# Patient Record
Sex: Female | Born: 1980
Health system: Southern US, Community
[De-identification: ages and names within clinical notes are randomized; demographics above are authoritative.]

## PROBLEM LIST (undated history)

## (undated) ENCOUNTER — Inpatient Hospital Stay (HOSPITAL_COMMUNITY): Payer: Self-pay

## (undated) HISTORY — PX: WISDOM TOOTH EXTRACTION: SHX21

---

## 2005-07-24 ENCOUNTER — Other Ambulatory Visit: Admission: RE | Admit: 2005-07-24 | Discharge: 2005-07-24 | Payer: Self-pay | Admitting: Gynecology

## 2010-09-03 ENCOUNTER — Inpatient Hospital Stay (INDEPENDENT_AMBULATORY_CARE_PROVIDER_SITE_OTHER)
Admission: RE | Admit: 2010-09-03 | Discharge: 2010-09-03 | Disposition: A | Discharge status: Home / Self Care | Payer: BC Managed Care – PPO | Source: Ambulatory Visit | Attending: Family Medicine | Admitting: Family Medicine

## 2010-09-03 DIAGNOSIS — R42 Dizziness and giddiness: Secondary | ICD-10-CM

## 2011-07-25 ENCOUNTER — Encounter (HOSPITAL_BASED_OUTPATIENT_CLINIC_OR_DEPARTMENT_OTHER): Payer: Self-pay

## 2011-07-25 ENCOUNTER — Emergency Department (HOSPITAL_BASED_OUTPATIENT_CLINIC_OR_DEPARTMENT_OTHER)
Admission: EM | Admit: 2011-07-25 | Discharge: 2011-07-25 | Disposition: A | Discharge status: Home Health | Payer: BC Managed Care – PPO | Attending: Emergency Medicine | Admitting: Emergency Medicine

## 2011-07-25 DIAGNOSIS — R51 Headache: Secondary | ICD-10-CM | POA: Insufficient documentation

## 2011-07-25 DIAGNOSIS — R1110 Vomiting, unspecified: Secondary | ICD-10-CM | POA: Insufficient documentation

## 2011-07-25 MED ORDER — METOCLOPRAMIDE HCL 10 MG PO TABS
10.0000 mg | ORAL_TABLET | Freq: Once | ORAL | Status: AC
  Administered 2011-07-25: 10 mg via ORAL
  Filled 2011-07-25: qty 1

## 2011-07-25 MED ORDER — HYDROCODONE-ACETAMINOPHEN 5-325 MG PO TABS
2.0000 | ORAL_TABLET | Freq: Once | ORAL | Status: AC
  Administered 2011-07-25: 2 via ORAL
  Filled 2011-07-25: qty 2

## 2011-07-25 NOTE — ED Notes (Addendum)
C/o HA n/v started 11am today-has taken tylenol and motrin

## 2011-07-25 NOTE — ED Provider Notes (Signed)
Medical screening examination/treatment/procedure(s) were performed by non-physician practitioner and as supervising physician I was immediately available for consultation/collaboration.   Heela Heishman M Frantz Quattrone, MD 07/25/11 1636 

## 2011-07-25 NOTE — Discharge Instructions (Signed)

## 2011-07-25 NOTE — ED Notes (Signed)
After meds given pt states she wants to go home-[facility]PA notified pt ready for d/c

## 2011-07-25 NOTE — ED Provider Notes (Signed)
History     CSN: [id number]  Arrival date & time 07/25/11  1416   First MD Initiated Contact with Patient 07/25/11 1452      Chief Complaint  Patient presents with  . Headache    (Consider location/radiation/quality/duration/timing/severity/associated sxs/prior treatment) Patient is a 31 y.o. female presenting with headaches. The history is provided by the patient. No language interpreter was used.  Headache  This is a new problem. The current episode started 3 to 5 hours ago. The problem occurs constantly. The problem has been rapidly worsening. The headache is associated with bright light. The pain is located in the frontal region. The pain is at a severity of 7/10. The pain is moderate. The pain does not radiate. Associated symptoms include vomiting. She has tried acetaminophen for the symptoms. The treatment provided no relief.  Pt reports she has a headache today.  No relief with tylenol at home.    Past Medical History  Diagnosis Date  . Migraine     History reviewed. No pertinent past surgical history.  No family history on file.  History  Substance Use Topics  . Smoking status: Never Smoker   . Smokeless tobacco: Not on file  . Alcohol Use: No    OB History    Grav Para Term Preterm Abortions TAB SAB Ect Mult Living                  Review of Systems  Gastrointestinal: Positive for vomiting.  Neurological: Positive for headaches.    Allergies  Review of patient's allergies indicates no known allergies.  Home Medications   Current Outpatient Rx  Name Route Sig Dispense Refill  . TYLENOL PO Oral Take by mouth.    . IBUPROFEN PO Oral Take by mouth.      BP 116/53  Pulse 69  Temp(Src) 98.3 F (36.8 C) (Oral)  Resp 16  Ht 5\' 8"  (1.727 m)  Wt 130 lb (58.968 kg)  BMI 19.77 kg/m2  SpO2 100%  LMP 07/11/2011  Physical Exam  Vitals reviewed. Constitutional: She is oriented to person, place, and time. She appears well-developed and well-nourished.    HENT:  Head: Normocephalic and atraumatic.  Right Ear: External ear normal.  Left Ear: External ear normal.  Nose: Nose normal.  Mouth/Throat: Oropharynx is clear and moist.  Eyes: Conjunctivae and EOM are normal. Pupils are equal, round, and reactive to light.  Neck: Normal range of motion. Neck supple.  Cardiovascular: Normal rate and normal heart sounds.   Pulmonary/Chest: Effort normal and breath sounds normal.  Abdominal: Soft. Bowel sounds are normal.  Musculoskeletal: Normal range of motion.  Neurological: She is alert and oriented to person, place, and time. She has normal reflexes.  Skin: Skin is warm.  Psychiatric: She has a normal mood and affect.    ED Course  Procedures (including critical care time)  Labs Reviewed - No data to display No results found.   1. Headache       MDM  Pt does not want injection.  Pt given reglan and hydrocodone  Po.        Langston Masker, [place] 07/25/11 [phone]

## 2013-07-12 LAB — OB RESULTS CONSOLE ABO/RH: RH TYPE: POSITIVE

## 2013-07-12 LAB — OB RESULTS CONSOLE RPR: RPR: NONREACTIVE

## 2013-07-12 LAB — OB RESULTS CONSOLE HIV ANTIBODY (ROUTINE TESTING): HIV: NONREACTIVE

## 2013-07-12 LAB — OB RESULTS CONSOLE RUBELLA ANTIBODY, IGM: Rubella: IMMUNE

## 2013-07-12 LAB — OB RESULTS CONSOLE ANTIBODY SCREEN: ANTIBODY SCREEN: NEGATIVE

## 2013-07-12 LAB — OB RESULTS CONSOLE HEPATITIS B SURFACE ANTIGEN: Hepatitis B Surface Ag: NEGATIVE

## 2013-11-07 ENCOUNTER — Ambulatory Visit: Payer: BC Managed Care – PPO | Admitting: Family Medicine

## 2013-11-07 ENCOUNTER — Encounter: Payer: Self-pay | Admitting: Family Medicine

## 2013-11-07 VITALS — BP 121/67 | HR 121 | Temp 98.9°F | Wt 175.8 lb

## 2013-11-07 DIAGNOSIS — Z98891 History of uterine scar from previous surgery: Secondary | ICD-10-CM | POA: Insufficient documentation

## 2013-11-07 DIAGNOSIS — O4700 False labor before 37 completed weeks of gestation, unspecified trimester: Secondary | ICD-10-CM | POA: Insufficient documentation

## 2013-11-07 DIAGNOSIS — R000 Tachycardia, unspecified: Secondary | ICD-10-CM

## 2013-11-07 DIAGNOSIS — Z3480 Encounter for supervision of other normal pregnancy, unspecified trimester: Secondary | ICD-10-CM

## 2013-11-07 LAB — POCT URINALYSIS DIP (DEVICE)
Bilirubin Urine: NEGATIVE
Glucose, UA: NEGATIVE mg/dL
Hgb urine dipstick: NEGATIVE
KETONES UR: NEGATIVE mg/dL
Nitrite: NEGATIVE
PH: 6.5 (ref 5.0–8.0)
PROTEIN: NEGATIVE mg/dL
SPECIFIC GRAVITY, URINE: 1.02 (ref 1.005–1.030)
UROBILINOGEN UA: 0.2 mg/dL (ref 0.0–1.0)

## 2013-11-07 MED ORDER — FLUCONAZOLE 150 MG PO TABS: 150.0000 mg | ORAL_TABLET | Freq: Every day | ORAL | Status: DC

## 2013-11-07 NOTE — Progress Notes (Signed)
Transfer from [facility] Was having contractions, assumed BH contractions Had some leaking of fluid--woke up in a puddle of fluid--has heavy vaginal discharge on exam-will treat for yeast Amnisure was negative at time of evaluation and cervix was closed.--cervix appears 2-3 cm at ext os and is soft, but is closed at the internal os--will have pt. Return in 3 days for FFN and decide on BMZ at that time.   Will attempt to get records--supposedly dating is off by > 2 wks.---review of records shows 13 wk u/s agrees within 4 days of LMP Reports 1 hour was 137--needs 3 hour.

## 2013-11-07 NOTE — Progress Notes (Signed)
U/S scheduled 11/10/13 at 3 pm.

## 2013-11-07 NOTE — Progress Notes (Signed)
Been having contractions for past 5 days, woke up to a bunch of fluid on Thursday morning was told her water was not broken and was closed

## 2013-11-07 NOTE — Patient Instructions (Addendum)
Third Trimester of Pregnancy The third trimester is from week 29 through week 42, months 7 through 9. The third trimester is a time when the fetus is growing rapidly. At the end of the ninth month, the fetus is about 20 inches in length and weighs 6 10 pounds.  BODY CHANGES Your body goes through many changes during pregnancy. The changes vary from woman to woman.   Your weight will continue to increase. You can expect to gain 25 35 pounds (11 16 kg) by the end of the pregnancy.  You may begin to get stretch marks on your hips, abdomen, and breasts.  You may urinate more often because the fetus is moving lower into your pelvis and pressing on your bladder.  You may develop or continue to have heartburn as a result of your pregnancy.  You may develop constipation because certain hormones are causing the muscles that push waste through your intestines to slow down.  You may develop hemorrhoids or swollen, bulging veins (varicose veins).  You may have pelvic pain because of the weight gain and pregnancy hormones relaxing your joints between the bones in your pelvis. Back aches may result from over exertion of the muscles supporting your posture.  Your breasts will continue to grow and be tender. A yellow discharge may leak from your breasts called colostrum.  Your belly button may stick out.  You may feel short of breath because of your expanding uterus.  You may notice the fetus "dropping," or moving lower in your abdomen.  You may have a bloody mucus discharge. This usually occurs a few days to a week before labor begins.  Your cervix becomes thin and soft (effaced) near your due date. WHAT TO EXPECT AT YOUR PRENATAL EXAMS  You will have prenatal exams every 2 weeks until week 36. Then, you will have weekly prenatal exams. During a routine prenatal visit:  You will be weighed to make sure you and the fetus are growing normally.  Your blood pressure is taken.  Your abdomen will  be measured to track your baby's growth.  The fetal heartbeat will be listened to.  Any test results from the previous visit will be discussed.  You may have a cervical check near your due date to see if you have effaced. At around 36 weeks, your caregiver will check your cervix. At the same time, your caregiver will also perform a test on the secretions of the vaginal tissue. This test is to determine if a type of bacteria, Group B streptococcus, is present. Your caregiver will explain this further. Your caregiver may ask you:  What your birth plan is.  How you are feeling.  If you are feeling the baby move.  If you have had any abnormal symptoms, such as leaking fluid, bleeding, severe headaches, or abdominal cramping.  If you have any questions. Other tests or screenings that may be performed during your third trimester include:  Blood tests that check for low iron levels (anemia).  Fetal testing to check the health, activity level, and growth of the fetus. Testing is done if you have certain medical conditions or if there are problems during the pregnancy. FALSE LABOR You may feel small, irregular contractions that eventually go away. These are called Braxton Hicks contractions, or false labor. Contractions may last for hours, days, or even weeks before true labor sets in. If contractions come at regular intervals, intensify, or become painful, it is best to be seen by your caregiver.    SIGNS OF LABOR   Menstrual-like cramps.  Contractions that are 5 minutes apart or less.  Contractions that start on the top of the uterus and spread down to the lower abdomen and back.  A sense of increased pelvic pressure or back pain.  A watery or bloody mucus discharge that comes from the vagina. If you have any of these signs before the 37th week of pregnancy, call your caregiver right away. You need to go to the hospital to get checked immediately. HOME CARE INSTRUCTIONS   Avoid all  smoking, herbs, alcohol, and unprescribed drugs. These chemicals affect the formation and growth of the baby.  Follow your caregiver's instructions regarding medicine use. There are medicines that are either safe or unsafe to take during pregnancy.  Exercise only as directed by your caregiver. Experiencing uterine cramps is a good sign to stop exercising.  Continue to eat regular, healthy meals.  Wear a good support bra for breast tenderness.  Do not use hot tubs, steam rooms, or saunas.  Wear your seat belt at all times when driving.  Avoid raw meat, uncooked cheese, cat litter boxes, and soil used by cats. These carry germs that can cause birth defects in the baby.  Take your prenatal vitamins.  Try taking a stool softener (if your caregiver approves) if you develop constipation. Eat more high-fiber foods, such as fresh vegetables or fruit and whole grains. Drink plenty of fluids to keep your urine clear or pale yellow.  Take warm sitz baths to soothe any pain or discomfort caused by hemorrhoids. Use hemorrhoid cream if your caregiver approves.  If you develop varicose veins, wear support hose. Elevate your feet for 15 minutes, 3 4 times a day. Limit salt in your diet.  Avoid heavy lifting, wear low heal shoes, and practice good posture.  Rest a lot with your legs elevated if you have leg cramps or low back pain.  Visit your dentist if you have not gone during your pregnancy. Use a soft toothbrush to brush your teeth and be gentle when you floss.  A sexual relationship may be continued unless your caregiver directs you otherwise.  Do not travel far distances unless it is absolutely necessary and only with the approval of your caregiver.  Take prenatal classes to understand, practice, and ask questions about the labor and delivery.  Make a trial run to the hospital.  Pack your hospital bag.  Prepare the baby's nursery.  Continue to go to all your prenatal visits as directed  by your caregiver. SEEK MEDICAL CARE IF:  You are unsure if you are in labor or if your water has broken.  You have dizziness.  You have mild pelvic cramps, pelvic pressure, or nagging pain in your abdominal area.  You have persistent nausea, vomiting, or diarrhea.  You have a bad smelling vaginal discharge.  You have pain with urination. SEEK IMMEDIATE MEDICAL CARE IF:   You have a fever.  You are leaking fluid from your vagina.  You have spotting or bleeding from your vagina.  You have severe abdominal cramping or pain.  You have rapid weight loss or gain.  You have shortness of breath with chest pain.  You notice sudden or extreme swelling of your face, hands, ankles, feet, or legs.  You have not felt your baby move in over an hour.  You have severe headaches that do not go away with medicine.  You have vision changes. Document Released: 05/13/2001 Document Revised: 01/19/2013 Document Reviewed:   07/20/2012 [employer] Patient Information 2014 [employer].  Breastfeeding Deciding to breastfeed is one of the best choices you can make for you and your baby. A change in hormones during pregnancy causes your breast tissue to grow and increases the number and size of your milk ducts. These hormones also allow proteins, sugars, and fats from your blood supply to make breast milk in your milk-producing glands. Hormones prevent breast milk from being released before your baby is born as well as prompt milk flow after birth. Once breastfeeding has begun, thoughts of your baby, as well as his or her sucking or crying, can stimulate the release of milk from your milk-producing glands.  BENEFITS OF BREASTFEEDING For Your Baby  Your first milk (colostrum) helps your baby's digestive system function better.   There are antibodies in your milk that help your baby fight off infections.   Your baby has a lower incidence of asthma, allergies, and sudden infant death syndrome.    The nutrients in breast milk are better for your baby than infant formulas and are designed uniquely for your baby's needs.   Breast milk improves your baby's brain development.   Your baby is less likely to develop other conditions, such as childhood obesity, asthma, or type 2 diabetes mellitus.  For You   Breastfeeding helps to create a very special bond between you and your baby.   Breastfeeding is convenient. Breast milk is always available at the correct temperature and costs nothing.   Breastfeeding helps to burn calories and helps you lose the weight gained during pregnancy.   Breastfeeding makes your uterus contract to its prepregnancy size faster and slows bleeding (lochia) after you give birth.   Breastfeeding helps to lower your risk of developing type 2 diabetes mellitus, osteoporosis, and breast or ovarian cancer later in life. SIGNS THAT YOUR BABY IS HUNGRY Early Signs of Hunger  Increased alertness or activity.  Stretching.  Movement of the head from side to side.  Movement of the head and opening of the mouth when the corner of the mouth or cheek is stroked (rooting).  Increased sucking sounds, smacking lips, cooing, sighing, or squeaking.  Hand-to-mouth movements.  Increased sucking of fingers or hands. Late Signs of Hunger  Fussing.  Intermittent crying. Extreme Signs of Hunger Signs of extreme hunger will require calming and consoling before your baby will be able to breastfeed successfully. Do not wait for the following signs of extreme hunger to occur before you initiate breastfeeding:   Restlessness.  A loud, strong cry.   Screaming. BREASTFEEDING BASICS Breastfeeding Initiation  Find a comfortable place to sit or lie down, with your neck and back well supported.  Place a pillow or rolled up blanket under your baby to bring him or her to the level of your breast (if you are seated). Nursing pillows are specially designed to help  support your arms and your baby while you breastfeed.  Make sure that your baby's abdomen is facing your abdomen.   Gently massage your breast. With your fingertips, massage from your chest wall toward your nipple in a circular motion. This encourages milk flow. You may need to continue this action during the feeding if your milk flows slowly.  Support your breast with 4 fingers underneath and your thumb above your nipple. Make sure your fingers are well away from your nipple and your baby's mouth.   Stroke your baby's lips gently with your finger or nipple.   When your baby's mouth is   open wide enough, quickly bring your baby to your breast, placing your entire nipple and as much of the colored area around your nipple (areola) as possible into your baby's mouth.   More areola should be visible above your baby's upper lip than below the lower lip.   Your baby's tongue should be between his or her lower gum and your breast.   Ensure that your baby's mouth is correctly positioned around your nipple (latched). Your baby's lips should create a seal on your breast and be turned out (everted).  It is common for your baby to suck about 2 3 minutes in order to start the flow of breast milk. Latching Teaching your baby how to latch on to your breast properly is very important. An improper latch can cause nipple pain and decreased milk supply for you and poor weight gain in your baby. Also, if your baby is not latched onto your nipple properly, he or she may swallow some air during feeding. This can make your baby fussy. Burping your baby when you switch breasts during the feeding can help to get rid of the air. However, teaching your baby to latch on properly is still the best way to prevent fussiness from swallowing air while breastfeeding. Signs that your baby has successfully latched on to your nipple:    Silent tugging or silent sucking, without causing you pain.   Swallowing heard  between every 3 4 sucks.    Muscle movement above and in front of his or her ears while sucking.  Signs that your baby has not successfully latched on to nipple:   Sucking sounds or smacking sounds from your baby while breastfeeding.  Nipple pain. If you think your baby has not latched on correctly, slip your finger into the corner of your baby's mouth to break the suction and place it between your baby's gums. Attempt breastfeeding initiation again. Signs of Successful Breastfeeding Signs from your baby:   A gradual decrease in the number of sucks or complete cessation of sucking.   Falling asleep.   Relaxation of his or her body.   Retention of a small amount of milk in his or her mouth.   Letting go of your breast by himself or herself. Signs from you:  Breasts that have increased in firmness, weight, and size 1 3 hours after feeding.   Breasts that are softer immediately after breastfeeding.  Increased milk volume, as well as a change in milk consistency and color by the 5th day of breastfeeding.   Nipples that are not sore, cracked, or bleeding. Signs That Your Baby is Getting Enough Milk  Wetting at least 3 diapers in a 24-hour period. The urine should be clear and pale yellow by age 5 days.  At least 3 stools in a 24-hour period by age 5 days. The stool should be soft and yellow.  At least 3 stools in a 24-hour period by age 7 days. The stool should be seedy and yellow.  No loss of weight greater than 10% of birth weight during the first 3 days of age.  Average weight gain of 4 7 ounces (120 210 mL) per week after age 4 days.  Consistent daily weight gain by age 5 days, without weight loss after the age of 2 weeks. After a feeding, your baby may spit up a small amount. This is common. BREASTFEEDING FREQUENCY AND DURATION Frequent feeding will help you make more milk and can prevent sore nipples and breast engorgement.   Breastfeed when you feel the need to  reduce the fullness of your breasts or when your baby shows signs of hunger. This is called "breastfeeding on demand." Avoid introducing a pacifier to your baby while you are working to establish breastfeeding (the first 4 6 weeks after your baby is born). After this time you may choose to use a pacifier. Research has shown that pacifier use during the first year of a baby's life decreases the risk of sudden infant death syndrome (SIDS). Allow your baby to feed on each breast as long as he or she wants. Breastfeed until your baby is finished feeding. When your baby unlatches or falls asleep while feeding from the first breast, offer the second breast. Because newborns are often sleepy in the first few weeks of life, you may need to awaken your baby to get him or her to feed. Breastfeeding times will vary from baby to baby. However, the following rules can serve as a guide to help you ensure that your baby is properly fed:  Newborns (babies 4 weeks of age or younger) may breastfeed every 1 3 hours.  Newborns should not go longer than 3 hours during the day or 5 hours during the night without breastfeeding.  You should breastfeed your baby a minimum of 8 times in a 24-hour period until you begin to introduce solid foods to your baby at around 6 months of age. BREAST MILK PUMPING Pumping and storing breast milk allows you to ensure that your baby is exclusively fed your breast milk, even at times when you are unable to breastfeed. This is especially important if you are going back to work while you are still breastfeeding or when you are not able to be present during feedings. Your lactation consultant can give you guidelines on how long it is safe to store breast milk.  A breast pump is a machine that allows you to pump milk from your breast into a sterile bottle. The pumped breast milk can then be stored in a refrigerator or freezer. Some breast pumps are operated by hand, while others use electricity. Ask  your lactation consultant which type will work best for you. Breast pumps can be purchased, but some hospitals and breastfeeding support groups lease breast pumps on a monthly basis. A lactation consultant can teach you how to hand express breast milk, if you prefer not to use a pump.  CARING FOR YOUR BREASTS WHILE YOU BREASTFEED Nipples can become dry, cracked, and sore while breastfeeding. The following recommendations can help keep your breasts moisturized and healthy:  Avoid using soap on your nipples.   Wear a supportive bra. Although not required, special nursing bras and tank tops are designed to allow access to your breasts for breastfeeding without taking off your entire bra or top. Avoid wearing underwire style bras or extremely tight bras.  Air dry your nipples for 3 4minutes after each feeding.   Use only cotton bra pads to absorb leaked breast milk. Leaking of breast milk between feedings is normal.   Use lanolin on your nipples after breastfeeding. Lanolin helps to maintain your skin's normal moisture barrier. If you use pure lanolin you do not need to wash it off before feeding your baby again. Pure lanolin is not toxic to your baby. You may also hand express a few drops of breast milk and gently massage that milk into your nipples and allow the milk to air dry. In the first few weeks after giving birth, some women   experience extremely full breasts (engorgement). Engorgement can make your breasts feel heavy, warm, and tender to the touch. Engorgement peaks within 3 5 days after you give birth. The following recommendations can help ease engorgement:  Completely empty your breasts while breastfeeding or pumping. You may want to start by applying warm, moist heat (in the shower or with warm water-soaked hand towels) just before feeding or pumping. This increases circulation and helps the milk flow. If your baby does not completely empty your breasts while breastfeeding, pump any extra  milk after he or she is finished.  Wear a snug bra (nursing or regular) or tank top for 1 2 days to signal your body to slightly decrease milk production.  Apply ice packs to your breasts, unless this is too uncomfortable for you.  Make sure that your baby is latched on and positioned properly while breastfeeding. If engorgement persists after 48 hours of following these recommendations, contact your health care provider or a Advertising copywriter. OVERALL HEALTH CARE RECOMMENDATIONS WHILE BREASTFEEDING  Eat healthy foods. Alternate between meals and snacks, eating 3 of each per day. Because what you eat affects your breast milk, some of the foods may make your baby more irritable than usual. Avoid eating these foods if you are sure that they are negatively affecting your baby.  Drink milk, fruit juice, and water to satisfy your thirst (about 10 glasses a day).   Rest often, relax, and continue to take your prenatal vitamins to prevent fatigue, stress, and anemia.  Continue breast self-awareness checks.  Avoid chewing and smoking tobacco.  Avoid alcohol and drug use. Some medicines that may be harmful to your baby can pass through breast milk. It is important to ask your health care provider before taking any medicine, including all over-the-counter and prescription medicine as well as vitamin and herbal supplements. It is possible to become pregnant while breastfeeding. If birth control is desired, ask your health care provider about options that will be safe for your baby. SEEK MEDICAL CARE IF:   You feel like you want to stop breastfeeding or have become frustrated with breastfeeding.  You have painful breasts or nipples.  Your nipples are cracked or bleeding.  Your breasts are red, tender, or warm.  You have a swollen area on either breast.  You have a fever or chills.  You have nausea or vomiting.  You have drainage other than breast milk from your nipples.  Your breasts  do not become full before feedings by the 5th day after you give birth.  You feel sad and depressed.  Your baby is too sleepy to eat well.  Your baby is having trouble sleeping.   Your baby is wetting less than 3 diapers in a 24-hour period.  Your baby has less than 3 stools in a 24-hour period.  Your baby's skin or the white part of his or her eyes becomes yellow.   Your baby is not gaining weight by 46 days of age. SEEK IMMEDIATE MEDICAL CARE IF:   Your baby is overly tired (lethargic) and does not want to wake up and feed.  Your baby develops an unexplained fever. Document Released: 05/19/2005 Document Revised: 01/19/2013 Document Reviewed: 11/10/2012 [facility] Patient Information 2014 [place], [place]. Preterm Labor Information Preterm labor is when labor starts at less than 37 weeks of pregnancy. The normal length of a pregnancy is 39 to 41 weeks. CAUSES Often, there is no identifiable underlying cause as to why a woman goes into preterm labor.  One of the most common known causes of preterm labor is infection. Infections of the uterus, cervix, vagina, amniotic sac, bladder, kidney, or even the lungs (pneumonia) can cause labor to start. Other suspected causes of preterm labor include:   Urogenital infections, such as yeast infections and bacterial vaginosis.   Uterine abnormalities (uterine shape, uterine septum, fibroids, or bleeding from the placenta).   A cervix that has been operated on (it may fail to stay closed).   Malformations in the fetus.   Multiple gestations (twins, triplets, and so on).   Breakage of the amniotic sac.  RISK FACTORS  Having a previous history of preterm labor.   Having premature rupture of membranes (PROM).   Having a placenta that covers the opening of the cervix (placenta previa).   Having a placenta that separates from the uterus (placental abruption).   Having a cervix that is too weak to hold the fetus in the uterus  (incompetent cervix).   Having too much fluid in the amniotic sac (polyhydramnios).   Taking illegal drugs or smoking while pregnant.   Not gaining enough weight while pregnant.   Being younger than 1618 and older than 33 years old.   Having a low socioeconomic status.   Being African American. SYMPTOMS Signs and symptoms of preterm labor include:   Menstrual-like cramps, abdominal pain, or back pain.  Uterine contractions that are regular, as frequent as six in an hour, regardless of their intensity (may be mild or painful).  Contractions that start on the top of the uterus and spread down to the lower abdomen and back.   A sense of increased pelvic pressure.   A watery or bloody mucus discharge that comes from the vagina.  TREATMENT Depending on the length of the pregnancy and other circumstances, your health care provider may suggest bed rest. If necessary, there are medicines that can be given to stop contractions and to mature the fetal lungs. If labor happens before 34 weeks of pregnancy, a prolonged hospital stay may be recommended. Treatment depends on the condition of both you and the fetus.  WHAT SHOULD YOU DO IF YOU THINK YOU ARE IN PRETERM LABOR? Call your health care provider right away. You will need to go to the hospital to get checked immediately. HOW CAN YOU PREVENT PRETERM LABOR IN FUTURE PREGNANCIES? You should:   Stop smoking if you smoke.  Maintain healthy weight gain and avoid chemicals and drugs that are not necessary.  Be watchful for any type of infection.  Inform your health care provider if you have a known history of preterm labor. Document Released: 08/09/2003 Document Revised: 01/19/2013 Document Reviewed: 06/21/2012 [facility] Patient Information 2014 [place], [place].

## 2013-11-08 ENCOUNTER — Encounter: Payer: Self-pay | Admitting: *Deleted

## 2013-11-10 ENCOUNTER — Ambulatory Visit (INDEPENDENT_AMBULATORY_CARE_PROVIDER_SITE_OTHER): Payer: BC Managed Care – PPO | Admitting: Obstetrics & Gynecology

## 2013-11-10 ENCOUNTER — Telehealth: Payer: Self-pay

## 2013-11-10 ENCOUNTER — Encounter (HOSPITAL_COMMUNITY): Payer: Self-pay

## 2013-11-10 ENCOUNTER — Ambulatory Visit (HOSPITAL_COMMUNITY)
Admission: RE | Admit: 2013-11-10 | Discharge: 2013-11-10 | Disposition: A | Discharge status: Home / Self Care | Payer: BC Managed Care – PPO | Source: Ambulatory Visit | Attending: Family Medicine | Admitting: Family Medicine

## 2013-11-10 VITALS — BP 112/76 | HR 106 | Temp 98.1°F | Wt 174.4 lb

## 2013-11-10 DIAGNOSIS — Z3689 Encounter for other specified antenatal screening: Secondary | ICD-10-CM | POA: Insufficient documentation

## 2013-11-10 DIAGNOSIS — O4700 False labor before 37 completed weeks of gestation, unspecified trimester: Secondary | ICD-10-CM

## 2013-11-10 DIAGNOSIS — O99810 Abnormal glucose complicating pregnancy: Secondary | ICD-10-CM

## 2013-11-10 DIAGNOSIS — Z3480 Encounter for supervision of other normal pregnancy, unspecified trimester: Secondary | ICD-10-CM

## 2013-11-10 DIAGNOSIS — Z23 Encounter for immunization: Secondary | ICD-10-CM

## 2013-11-10 DIAGNOSIS — O479 False labor, unspecified: Secondary | ICD-10-CM

## 2013-11-10 LAB — POCT URINALYSIS DIP (DEVICE)
Bilirubin Urine: NEGATIVE
GLUCOSE, UA: NEGATIVE mg/dL
HGB URINE DIPSTICK: NEGATIVE
Ketones, ur: NEGATIVE mg/dL
Leukocytes, UA: NEGATIVE
NITRITE: NEGATIVE
PH: 6 (ref 5.0–8.0)
PROTEIN: NEGATIVE mg/dL
SPECIFIC GRAVITY, URINE: 1.01 (ref 1.005–1.030)
UROBILINOGEN UA: 0.2 mg/dL (ref 0.0–1.0)

## 2013-11-10 LAB — CBC
HEMATOCRIT: 30.4 % — AB (ref 36.0–46.0)
HEMOGLOBIN: 10.5 g/dL — AB (ref 12.0–15.0)
MCH: 30.3 pg (ref 26.0–34.0)
MCHC: 34.5 g/dL (ref 30.0–36.0)
MCV: 87.6 fL (ref 78.0–100.0)
Platelets: 206 10*3/uL (ref 150–400)
RBC: 3.47 MIL/uL — ABNORMAL LOW (ref 3.87–5.11)
RDW: 14.4 % (ref 11.5–15.5)
WBC: 12.2 10*3/uL — AB (ref 4.0–10.5)

## 2013-11-10 LAB — FETAL FIBRONECTIN: FETAL FIBRONECTIN: INVALID

## 2013-11-10 MED ORDER — TETANUS-DIPHTH-ACELL PERTUSSIS 5-2.5-18.5 LF-MCG/0.5 IM SUSP
0.5000 mL | Freq: Once | INTRAMUSCULAR | Status: AC
  Administered 2013-11-10: 0.5 mL via INTRAMUSCULAR

## 2013-11-10 NOTE — Progress Notes (Signed)
Unsure concerning Tdap vaccine

## 2013-11-10 NOTE — Addendum Note (Signed)
Addended by: Sherre Lain A on: 11/10/2013 11:29 AM   Modules accepted: Orders

## 2013-11-10 NOTE — Progress Notes (Signed)
Still reports contractions 4-6 times/hour.  No IC or digital exams in last 3 days. FFN done today, will follow up results and manage accordingly.  May need prophylactic betamethasone. Of note, no cervical change since 11/07/13.  3 hr GTT done today, will follow up results and manage accordingly.  No other complaints or concerns.  Fetal movement and labor precautions reviewed.

## 2013-11-10 NOTE — Telephone Encounter (Signed)
Patient came to clinic for results.

## 2013-11-10 NOTE — Patient Instructions (Signed)
Return to clinic for any obstetric concerns or go to [facility] for evaluation  

## 2013-11-10 NOTE — Telephone Encounter (Signed)
Attempted to call patient to inform of FFN results.  No answer, left message for patient to call the clinic for results.

## 2013-11-11 ENCOUNTER — Encounter: Payer: Self-pay | Admitting: Obstetrics & Gynecology

## 2013-11-11 LAB — GLUCOSE TOLERANCE, 3 HOURS
GLUCOSE 3 HOUR GTT: 148 mg/dL — AB (ref 70–144)
GLUCOSE, 2 HOUR-GESTATIONAL: 131 mg/dL (ref 70–164)
Glucose Tolerance, 1 hour: 150 mg/dL (ref 70–189)
Glucose Tolerance, Fasting: 81 mg/dL (ref 70–104)

## 2013-11-11 LAB — HIV ANTIBODY (ROUTINE TESTING W REFLEX): HIV: NONREACTIVE

## 2013-11-11 LAB — TSH: TSH: 2.612 u[IU]/mL (ref 0.350–4.500)

## 2013-11-11 LAB — RPR

## 2013-11-12 LAB — PRESCRIPTION MONITORING PROFILE (19 PANEL)
Amphetamine/Meth: NEGATIVE ng/mL
BARBITURATE SCREEN, URINE: NEGATIVE ng/mL
BENZODIAZEPINE SCREEN, URINE: NEGATIVE ng/mL
BUPRENORPHINE, URINE: NEGATIVE ng/mL
CANNABINOID SCRN UR: NEGATIVE ng/mL
COCAINE METABOLITES: NEGATIVE ng/mL
Carisoprodol, Urine: NEGATIVE ng/mL
Creatinine, Urine: 37.69 mg/dL (ref >20.0)
ECSTASY: NEGATIVE ng/mL
FENTANYL URINE: NEGATIVE ng/mL
MEPERIDINE UR: NEGATIVE ng/mL
METHAQUALONE SCREEN (URINE): NEGATIVE ng/mL
Methadone Screen, Urine: NEGATIVE ng/mL
Nitrites, Initial: NEGATIVE ug/mL
OPIATE SCREEN, URINE: NEGATIVE ng/mL
Oxycodone Screen, Ur: NEGATIVE ng/mL
PH URINE, INITIAL: 6.3 pH (ref 4.5–8.9)
PHENCYCLIDINE, UR: NEGATIVE ng/mL
Propoxyphene: NEGATIVE ng/mL
TRAMADOL UR: NEGATIVE ng/mL
Tapentadol, urine: NEGATIVE ng/mL
ZOLPIDEM, URINE: NEGATIVE ng/mL

## 2013-11-24 ENCOUNTER — Ambulatory Visit (INDEPENDENT_AMBULATORY_CARE_PROVIDER_SITE_OTHER): Payer: BC Managed Care – PPO | Admitting: Obstetrics & Gynecology

## 2013-11-24 VITALS — BP 116/73 | HR 124 | Temp 98.8°F | Wt 176.9 lb

## 2013-11-24 DIAGNOSIS — I498 Other specified cardiac arrhythmias: Secondary | ICD-10-CM

## 2013-11-24 DIAGNOSIS — Z3483 Encounter for supervision of other normal pregnancy, third trimester: Secondary | ICD-10-CM

## 2013-11-24 DIAGNOSIS — Z3480 Encounter for supervision of other normal pregnancy, unspecified trimester: Secondary | ICD-10-CM

## 2013-11-24 DIAGNOSIS — R000 Tachycardia, unspecified: Secondary | ICD-10-CM

## 2013-11-24 LAB — POCT URINALYSIS DIP (DEVICE)
Glucose, UA: NEGATIVE mg/dL
Hgb urine dipstick: NEGATIVE
NITRITE: NEGATIVE
PH: 6 (ref 5.0–8.0)
PROTEIN: 30 mg/dL — AB
Specific Gravity, Urine: 1.025 (ref 1.005–1.030)
Urobilinogen, UA: 1 mg/dL (ref 0.0–1.0)

## 2013-11-24 NOTE — Progress Notes (Signed)
Pelvic pressure normal.  No other complaints or concerns.  Fetal movement and labor precautions reviewed. On review of anatomy scan at 30 weeks, accelerated fetal growth noted with limited anatomy views; EFW 88% (AC >97%). Will reevaluate at 34 week scan, ordered today. Abnormal 1 hr GTT, normal 3 hr GTT. Told to watch carbohydrate intake. No other complaints or concerns.  Fetal movement and labor precautions reviewed.

## 2013-11-24 NOTE — Progress Notes (Signed)
U/S scheduled with MFM on 12/08/13 at 1130 am.

## 2013-11-24 NOTE — Progress Notes (Signed)
Patient reports a lot of pelvic pressure

## 2013-11-24 NOTE — Patient Instructions (Signed)
Return to clinic for any obstetric concerns or go to [facility] for evaluation  

## 2013-11-28 ENCOUNTER — Inpatient Hospital Stay (HOSPITAL_COMMUNITY)
Admission: AD | Admit: 2013-11-28 | Discharge: 2013-11-28 | Disposition: A | Discharge status: Home / Self Care | Payer: BC Managed Care – PPO | Source: Ambulatory Visit | Attending: Obstetrics & Gynecology | Admitting: Obstetrics & Gynecology

## 2013-11-28 ENCOUNTER — Encounter (HOSPITAL_COMMUNITY): Payer: Self-pay | Admitting: *Deleted

## 2013-11-28 DIAGNOSIS — O4703 False labor before 37 completed weeks of gestation, third trimester: Secondary | ICD-10-CM

## 2013-11-28 DIAGNOSIS — O4700 False labor before 37 completed weeks of gestation, unspecified trimester: Secondary | ICD-10-CM | POA: Insufficient documentation

## 2013-11-28 LAB — URINALYSIS, ROUTINE W REFLEX MICROSCOPIC
BILIRUBIN URINE: NEGATIVE
GLUCOSE, UA: NEGATIVE mg/dL
HGB URINE DIPSTICK: NEGATIVE
Ketones, ur: NEGATIVE mg/dL
Leukocytes, UA: NEGATIVE
Nitrite: NEGATIVE
Protein, ur: NEGATIVE mg/dL
Urobilinogen, UA: 0.2 mg/dL (ref 0.0–1.0)
pH: 5.5 (ref 5.0–8.0)

## 2013-11-28 LAB — WET PREP, GENITAL
Clue Cells Wet Prep HPF POC: NONE SEEN
Trich, Wet Prep: NONE SEEN
Yeast Wet Prep HPF POC: NONE SEEN

## 2013-11-28 LAB — FETAL FIBRONECTIN: FETAL FIBRONECTIN: NEGATIVE

## 2013-11-28 MED ORDER — NIFEDIPINE 10 MG PO CAPS: 20.0000 mg | ORAL_CAPSULE | Freq: Four times a day (QID) | ORAL | Status: DC

## 2013-11-28 MED ORDER — NIFEDIPINE 20 MG PO CAPS: 20.0000 mg | ORAL_CAPSULE | Freq: Four times a day (QID) | ORAL | Status: DC

## 2013-11-28 MED ORDER — NIFEDIPINE 10 MG PO CAPS
20.0000 mg | ORAL_CAPSULE | ORAL | Status: AC
  Administered 2013-11-28: 20 mg via ORAL
  Filled 2013-11-28: qty 2

## 2013-11-28 NOTE — MAU Provider Note (Signed)
Chief Complaint:  Labor Eval   First Provider Initiated Contact with Patient 11/28/13 [phone]      HPI: Kayla Patterson is a 33 y.o. G2P1001 at 2734w1d [facility] who [facility] to matern[facility] admissions reporting painful regular contractions with onset 1 hour prior to arrival in MAU.  She reports the first two contractions were 14 minutes apart, and then they were 8 minutes in the car on the way to the hospital.  She has hx of 4th degree tear with first pregnancy and has planned C/S with this pregnancy.  She also reports being on bedrest with her first pregnancy for preterm contractions but had a term delivery.  She reports less fetal movement than usual this morning but is feeling movement in MAU.  She denies LOF, vaginal bleeding, vaginal itching/burning, urinary symptoms, h/a, dizziness, n/v, or fever/chills.    Past Medical History: Past Medical History  Diagnosis Date  . Migraine     Past obstetric history: OB History  Gravida Para Term Preterm AB SAB TAB Ectopic Multiple Living  2 1 1  0 0 0 0 0 0 1    # Outcome Date GA Lbr Len/2nd Weight Sex Delivery Anes PTL Lv  2 CUR           1 TRM 03/31/04 677w0d  3.827 kg (8 lb 7 oz) M SVD EPI  Y     Comments: 4th degree laceration      Past Surgical History: Past Surgical History  Procedure Laterality Date  . Wisdom tooth extraction      Family History: Family History  Problem Relation Age of Onset  . Cancer Mother     Non-Hodgkins Lymphoma    Social History: History  Substance Use Topics  . Smoking status: Never Smoker   . Smokeless tobacco: Never Used  . Alcohol Use: No    Allergies: No Known Allergies  Meds:  Prescriptions prior to admission  Medication Sig Dispense Refill  . ferrous sulfate 325 (65 FE) MG tablet Take 325 mg by mouth daily with breakfast.      . prenatal vitamin w/FE, FA (PRENATAL 1 + 1) 27-1 MG TABS tablet Take 1 tablet by mouth daily at 12 noon.        ROS: Pertinent findings in history of present  illness.  Physical Exam  Blood pressure 122/67, pulse 111, temperature 98.2 F (36.8 C), temperature source Oral, resp. rate 18, last menstrual period 04/14/2013. GENERAL: Well-developed, well-nourished female in no acute distress.  HEENT: normocephalic HEART: normal rate RESP: normal effort ABDOMEN: Soft, non-tender, gravid appropriate for gestational age EXTREMITIES: Nontender, no edema NEURO: alert and oriented Pelvic exam: Cervix pink, visually closed, without lesion, large amount thin white discharge, vaginal walls and external genitalia with mild erythema   Dilation: Fingertip Effacement (%): Thick Cervical Position: Posterior Station: -3 Exam by:: LCraige Cotta. Kirby, CNM  No cervical change x1 hour in MAU  FHT:  Baseline 145 , moderate variability, accelerations present, no decelerations Contractions: q 8 mins with irritability in between   Labs:  Results for orders placed during the hospital encounter of 11/28/13 (from the past 168 hour(s))  WET PREP, GENITAL   Collection Time    11/28/13  8:59 AM      Result Value Ref Range   Yeast Wet Prep HPF POC NONE SEEN  NONE SEEN   Trich, Wet Prep NONE SEEN  NONE SEEN   Clue Cells Wet Prep HPF POC NONE SEEN  NONE SEEN   WBC,  Wet Prep HPF POC FEW (*) NONE SEEN  FETAL FIBRONECTIN   Collection Time    11/28/13  8:59 AM      Result Value Ref Range   Fetal Fibronectin NEGATIVE  NEGATIVE  URINALYSIS, ROUTINE W REFLEX MICROSCOPIC   Collection Time    11/28/13 10:55 AM      Result Value Ref Range   Color, Urine YELLOW  YELLOW   APPearance HAZY (*) CLEAR   Specific Gravity, Urine <1.005 (*) 1.005 - 1.030   pH 5.5  5.0 - 8.0   Glucose, UA NEGATIVE  NEGATIVE mg/dL   Hgb urine dipstick NEGATIVE  NEGATIVE   Bilirubin Urine NEGATIVE  NEGATIVE   Ketones, ur NEGATIVE  NEGATIVE mg/dL   Protein, ur NEGATIVE  NEGATIVE mg/dL   Urobilinogen, UA 0.2  0.0 - 1.0 mg/dL   Nitrite NEGATIVE  NEGATIVE   Leukocytes, UA NEGATIVE  NEGATIVE       Assessment: 1. Preterm uterine contractions, antepartum, third trimester     Plan: Procardia 20 mg x1 dose in MAU Discharge home PTL precautions and fetal kick counts Procardia 20 mg Q 6 hours PRN F/U in clinic as scheduled Return to MAU as needed for emergencies    Medication List    ASK your doctor about these medications       ferrous sulfate 325 (65 FE) MG tablet  Take 325 mg by mouth daily with breakfast.     prenatal vitamin w/FE, FA 27-1 MG Tabs tablet  Take 1 tablet by mouth daily at 12 noon.        Sharen CounterLisa Leftwich-Kirby Certified Nurse-Midwife 11/28/2013 8:55 AM

## 2013-11-28 NOTE — Discharge Instructions (Signed)

## 2013-11-28 NOTE — MAU Note (Signed)
Started feeling contractions and back pain at 0657 AM. No bleeding or leaking. States she plans to have a scheduled C/S this time due to 4th degree laceration with last baby 10 years ago. States the baby is measuring larger than dates.

## 2013-11-28 NOTE — MAU Note (Signed)
Patient states she is having contractions every 6 minutes. Denies bleeding or leaking. Reports no fetal movement this am. Patient mother states she is scheduled for a cesarean section.

## 2013-12-08 ENCOUNTER — Ambulatory Visit (HOSPITAL_COMMUNITY)
Admission: RE | Admit: 2013-12-08 | Discharge: 2013-12-08 | Disposition: A | Discharge status: Home / Self Care | Payer: BC Managed Care – PPO | Source: Ambulatory Visit | Attending: Obstetrics & Gynecology | Admitting: Obstetrics & Gynecology

## 2013-12-08 ENCOUNTER — Encounter (HOSPITAL_COMMUNITY): Payer: Self-pay

## 2013-12-08 ENCOUNTER — Ambulatory Visit (INDEPENDENT_AMBULATORY_CARE_PROVIDER_SITE_OTHER): Payer: BC Managed Care – PPO | Admitting: Family Medicine

## 2013-12-08 VITALS — BP 116/73 | HR 123 | Wt 178.9 lb

## 2013-12-08 DIAGNOSIS — Z3483 Encounter for supervision of other normal pregnancy, third trimester: Secondary | ICD-10-CM

## 2013-12-08 DIAGNOSIS — O4703 False labor before 37 completed weeks of gestation, third trimester: Secondary | ICD-10-CM

## 2013-12-08 DIAGNOSIS — Z3480 Encounter for supervision of other normal pregnancy, unspecified trimester: Secondary | ICD-10-CM

## 2013-12-08 DIAGNOSIS — O4700 False labor before 37 completed weeks of gestation, unspecified trimester: Secondary | ICD-10-CM

## 2013-12-08 DIAGNOSIS — Z3689 Encounter for other specified antenatal screening: Secondary | ICD-10-CM | POA: Insufficient documentation

## 2013-12-08 LAB — POCT URINALYSIS DIP (DEVICE)
BILIRUBIN URINE: NEGATIVE
GLUCOSE, UA: NEGATIVE mg/dL
HGB URINE DIPSTICK: NEGATIVE
KETONES UR: NEGATIVE mg/dL
Leukocytes, UA: NEGATIVE
Nitrite: NEGATIVE
PH: 6 (ref 5.0–8.0)
Protein, ur: NEGATIVE mg/dL
SPECIFIC GRAVITY, URINE: 1.01 (ref 1.005–1.030)
Urobilinogen, UA: 0.2 mg/dL (ref 0.0–1.0)

## 2013-12-08 MED ORDER — NIFEDIPINE 20 MG PO CAPS: 20.0000 mg | ORAL_CAPSULE | Freq: Four times a day (QID) | ORAL | Status: DC

## 2013-12-08 NOTE — Patient Instructions (Signed)
Third Trimester of Pregnancy The third trimester is from week 29 through week 42, months 7 through 9. The third trimester is a time when the fetus is growing rapidly. At the end of the ninth month, the fetus is about 20 inches in length and weighs 6-10 pounds.  BODY CHANGES Your body goes through many changes during pregnancy. The changes vary from woman to woman.   Your weight will continue to increase. You can expect to gain 25-35 pounds (11-16 kg) by the end of the pregnancy.  You may begin to get stretch marks on your hips, abdomen, and breasts.  You may urinate more often because the fetus is moving lower into your pelvis and pressing on your bladder.  You may develop or continue to have heartburn as a result of your pregnancy.  You may develop constipation because certain hormones are causing the muscles that push waste through your intestines to slow down.  You may develop hemorrhoids or swollen, bulging veins (varicose veins).  You may have pelvic pain because of the weight gain and pregnancy hormones relaxing your joints between the bones in your pelvis. Backaches may result from overexertion of the muscles supporting your posture.  You may have changes in your hair. These can include thickening of your hair, rapid growth, and changes in texture. Some women also have hair loss during or after pregnancy, or hair that feels dry or thin. Your hair will most likely return to normal after your baby is born.  Your breasts will continue to grow and be tender. A yellow discharge may leak from your breasts called colostrum.  Your belly button may stick out.  You may feel short of breath because of your expanding uterus.  You may notice the fetus "dropping," or moving lower in your abdomen.  You may have a bloody mucus discharge. This usually occurs a few days to a week before labor begins.  Your cervix becomes thin and soft (effaced) near your due date. WHAT TO EXPECT AT YOUR PRENATAL  EXAMS  You will have prenatal exams every 2 weeks until week 36. Then, you will have weekly prenatal exams. During a routine prenatal visit:  You will be weighed to make sure you and the fetus are growing normally.  Your blood pressure is taken.  Your abdomen will be measured to track your baby's growth.  The fetal heartbeat will be listened to.  Any test results from the previous visit will be discussed.  You may have a cervical check near your due date to see if you have effaced. At around 36 weeks, your caregiver will check your cervix. At the same time, your caregiver will also perform a test on the secretions of the vaginal tissue. This test is to determine if a type of bacteria, Group B streptococcus, is present. Your caregiver will explain this further. Your caregiver may ask you:  What your birth plan is.  How you are feeling.  If you are feeling the baby move.  If you have had any abnormal symptoms, such as leaking fluid, bleeding, severe headaches, or abdominal cramping.  If you have any questions. Other tests or screenings that may be performed during your third trimester include:  Blood tests that check for low iron levels (anemia).  Fetal testing to check the health, activity level, and growth of the fetus. Testing is done if you have certain medical conditions or if there are problems during the pregnancy. FALSE LABOR You may feel small, irregular contractions that   eventually go away. These are called Braxton Hicks contractions, or false labor. Contractions may last for hours, days, or even weeks before true labor sets in. If contractions come at regular intervals, intensify, or become painful, it is best to be seen by your caregiver.  SIGNS OF LABOR   Menstrual-like cramps.  Contractions that are 5 minutes apart or less.  Contractions that start on the top of the uterus and spread down to the lower abdomen and back.  A sense of increased pelvic pressure or back  pain.  A watery or bloody mucus discharge that comes from the vagina. If you have any of these signs before the 37th week of pregnancy, call your caregiver right away. You need to go to the hospital to get checked immediately. HOME CARE INSTRUCTIONS   Avoid all smoking, herbs, alcohol, and unprescribed drugs. These chemicals affect the formation and growth of the baby.  Follow your caregiver's instructions regarding medicine use. There are medicines that are either safe or unsafe to take during pregnancy.  Exercise only as directed by your caregiver. Experiencing uterine cramps is a good sign to stop exercising.  Continue to eat regular, healthy meals.  Wear a good support bra for breast tenderness.  Do not use hot tubs, steam rooms, or saunas.  Wear your seat belt at all times when driving.  Avoid raw meat, uncooked cheese, cat litter boxes, and soil used by cats. These carry germs that can cause birth defects in the baby.  Take your prenatal vitamins.  Try taking a stool softener (if your caregiver approves) if you develop constipation. Eat more high-fiber foods, such as fresh vegetables or fruit and whole grains. Drink plenty of fluids to keep your urine clear or pale yellow.  Take warm sitz baths to soothe any pain or discomfort caused by hemorrhoids. Use hemorrhoid cream if your caregiver approves.  If you develop varicose veins, wear support hose. Elevate your feet for 15 minutes, 3-4 times a day. Limit salt in your diet.  Avoid heavy lifting, wear low heal shoes, and practice good posture.  Rest a lot with your legs elevated if you have leg cramps or low back pain.  Visit your dentist if you have not gone during your pregnancy. Use a soft toothbrush to brush your teeth and be gentle when you floss.  A sexual relationship may be continued unless your caregiver directs you otherwise.  Do not travel far distances unless it is absolutely necessary and only with the approval  of your caregiver.  Take prenatal classes to understand, practice, and ask questions about the labor and delivery.  Make a trial run to the hospital.  Pack your hospital bag.  Prepare the baby's nursery.  Continue to go to all your prenatal visits as directed by your caregiver. SEEK MEDICAL CARE IF:  You are unsure if you are in labor or if your water has broken.  You have dizziness.  You have mild pelvic cramps, pelvic pressure, or nagging pain in your abdominal area.  You have persistent nausea, vomiting, or diarrhea.  You have a bad smelling vaginal discharge.  You have pain with urination. SEEK IMMEDIATE MEDICAL CARE IF:   You have a fever.  You are leaking fluid from your vagina.  You have spotting or bleeding from your vagina.  You have severe abdominal cramping or pain.  You have rapid weight loss or gain.  You have shortness of breath with chest pain.  You notice sudden or extreme swelling   of your face, hands, ankles, feet, or legs.  You have not felt your baby move in over an hour.  You have severe headaches that do not go away with medicine.  You have vision changes. Document Released: 05/13/2001 Document Revised: 05/24/2013 Document Reviewed: 07/20/2012 Exit[employer] Patient Information 2015 [employer]. This information is not intended to replace advice given to you by your health care provider. Make sure you discuss any questions you have with your health care provider.  

## 2013-12-08 NOTE — Progress Notes (Signed)
S: 33  yo G2P1001 @[redacted]w[redacted]d  here for ROBV - out of procardia. Still having ctx but only 4 an hour at most Has an US today for f/u growth.   O: see flowsheet.   A/P - doing well US today for growth - refill of procardia with plans to stop at 36 weeks.  - will need discussion at 36 weeks about elective primary csection vs. Vag del due to hx of 4th degree tear. Has already started the discussion but is waiting for results of US to decide.

## 2013-12-08 NOTE — Progress Notes (Signed)
Patient is out of procardia

## 2013-12-10 ENCOUNTER — Inpatient Hospital Stay (HOSPITAL_COMMUNITY)
Admission: AD | Admit: 2013-12-10 | Discharge: 2013-12-11 | Disposition: A | Discharge status: Home / Self Care | Payer: BC Managed Care – PPO | Source: Ambulatory Visit | Attending: Obstetrics and Gynecology | Admitting: Obstetrics and Gynecology

## 2013-12-10 ENCOUNTER — Encounter (HOSPITAL_COMMUNITY): Payer: Self-pay

## 2013-12-10 DIAGNOSIS — O4703 False labor before 37 completed weeks of gestation, third trimester: Secondary | ICD-10-CM

## 2013-12-10 DIAGNOSIS — O4700 False labor before 37 completed weeks of gestation, unspecified trimester: Secondary | ICD-10-CM | POA: Insufficient documentation

## 2013-12-10 LAB — URINALYSIS, ROUTINE W REFLEX MICROSCOPIC
Bilirubin Urine: NEGATIVE
GLUCOSE, UA: NEGATIVE mg/dL
HGB URINE DIPSTICK: NEGATIVE
Ketones, ur: 15 mg/dL — AB
Nitrite: NEGATIVE
PH: 5.5 (ref 5.0–8.0)
Protein, ur: NEGATIVE mg/dL
UROBILINOGEN UA: 0.2 mg/dL (ref 0.0–1.0)

## 2013-12-10 LAB — URINE MICROSCOPIC-ADD ON

## 2013-12-10 NOTE — MAU Note (Signed)
Contractions since 2030. Has been on procardia for PTL. Last dose 2130 of 20mg  which takes q 6hrs. Denies bleeding or leaking fld

## 2013-12-11 DIAGNOSIS — O4700 False labor before 37 completed weeks of gestation, unspecified trimester: Secondary | ICD-10-CM

## 2013-12-11 LAB — WET PREP, GENITAL
Clue Cells Wet Prep HPF POC: NONE SEEN
Trich, Wet Prep: NONE SEEN
YEAST WET PREP: NONE SEEN

## 2013-12-11 MED ORDER — LACTATED RINGERS IV BOLUS (SEPSIS)
1000.0000 mL | Freq: Once | INTRAVENOUS | Status: AC
  Administered 2013-12-11: 1000 mL via INTRAVENOUS

## 2013-12-11 MED ORDER — OXYCODONE-ACETAMINOPHEN 5-325 MG PO TABS
2.0000 | ORAL_TABLET | Freq: Once | ORAL | Status: AC
  Administered 2013-12-11: 2 via ORAL
  Filled 2013-12-11: qty 2

## 2013-12-11 MED ORDER — NIFEDIPINE 10 MG PO CAPS
20.0000 mg | ORAL_CAPSULE | Freq: Once | ORAL | Status: AC
  Administered 2013-12-11: 20 mg via ORAL
  Filled 2013-12-11: qty 2

## 2013-12-11 NOTE — MAU Provider Note (Signed)
None     Chief Complaint:  Contractions   IVOREE FELMLEE is  33 y.o. G2P1001 at [redacted]w[redacted]d presents complaining of Contractions Pt presents with contractions starting earlier today. Pt has been having frequent every 3-45minute contractions that have increased in frequency. Pt has been dealing with preterm contractions this pregnancy and has had similar symptoms with prior pregnancy that she carried to 44wks. (Hx of 4th degree tear, considering PLTCS)  Pt denies urinary symptoms, discharge or other complaints at this time. Pt has been on procardia to try to decrease preterm contractions and tends to have ~4/hr. Pt has been taking procardia every 6hr with minimal relief.  Obstetrical/Gynecological History: OB History   Grav Para Term Preterm Abortions TAB SAB Ect Mult Living   2 1 1  0 0 0 0 0 0 1     Past Medical History: Past Medical History  Diagnosis Date  . Migraine     Past Surgical History: Past Surgical History  Procedure Laterality Date  . Wisdom tooth extraction      Family History: Family History  Problem Relation Age of Onset  . Cancer Mother     Non-Hodgkins Lymphoma    Social History: History  Substance Use Topics  . Smoking status: Never Smoker   . Smokeless tobacco: Never Used  . Alcohol Use: No    Allergies: No Known Allergies  Meds:  Prescriptions prior to admission  Medication Sig Dispense Refill  . docusate sodium (COLACE) 100 MG capsule Take 100 mg by mouth 3 (three) times daily.      . ferrous sulfate 325 (65 FE) MG tablet Take 325 mg by mouth daily with breakfast.      . NIFEdipine (PROCARDIA) 20 MG capsule Take 1 capsule (20 mg total) by mouth every 6 (six) hours.  30 capsule  0  . Prenatal Vit-Fe Fumarate-FA (PRENATAL MULTIVITAMIN) TABS tablet Take 1 tablet by mouth daily at 12 noon.        Review of Systems -   Review of Systems  Constitutional: Negative for fever, chills, weight loss, malaise/fatigue and diaphoresis.  HENT: Negative for  hearing loss, ear pain, nosebleeds, congestion, sore throat, neck pain, tinnitus and ear discharge.   Eyes: Negative for blurred vision, double vision, photophobia, pain, discharge and redness.  Respiratory: Negative for cough, hemoptysis, sputum production, shortness of breath, wheezing and stridor.   Cardiovascular: Negative for chest pain, palpitations, orthopnea,  leg swelling  Gastrointestinal: Negative for abdominal pain heartburn, nausea, vomiting, diarrhea, constipation, blood in stool Genitourinary: Negative for dysuria, urgency, frequency, hematuria and flank pain.  Musculoskeletal: Negative for myalgias, back pain, joint pain and falls.  Skin: Negative for itching and rash.  Neurological: Negative for dizziness, tingling, tremors, sensory change, speech change, focal weakness, seizures, loss of consciousness, weakness and headaches.  Endo/Heme/Allergies: Negative for environmental allergies and polydipsia. Does not bruise/bleed easily.  Psychiatric/Behavioral: Negative for depression, suicidal ideas, hallucinations, memory loss and substance abuse. The patient is not nervous/anxious and does not have insomnia.      Physical Exam  Blood pressure 119/66, pulse 111, temperature 98.4 F (36.9 C), resp. rate 18, height 5\' 8"  (1.727 m), weight 82.192 kg (181 lb 3.2 oz), last menstrual period 04/14/2013. GENERAL: Well-developed, well-nourished female in no acute distress.  ABDOMEN: Soft, nontender, nondistended, gravid.  EXTREMITIES: Nontender, no edema, 2+ distal pulses. DTR's 2+ Dilation: Fingertip Effacement (%): Thick Exam by:: Dr Ike Bene  Presentation: cephalic FHT:  Baseline rate 130s bpm   Variability moderate  Accelerations  present   Decelerations none Contractions: Every 3-6 mins   Labs: Results for orders placed during the hospital encounter of 12/10/13 (from the past 24 hour(s))  URINALYSIS, ROUTINE W REFLEX MICROSCOPIC   Collection Time    12/10/13 11:35 PM      Result  Value Ref Range   Color, Urine YELLOW  YELLOW   APPearance CLEAR  CLEAR   Specific Gravity, Urine <1.005 (*) 1.005 - 1.030   pH 5.5  5.0 - 8.0   Glucose, UA NEGATIVE  NEGATIVE mg/dL   Hgb urine dipstick NEGATIVE  NEGATIVE   Bilirubin Urine NEGATIVE  NEGATIVE   Ketones, ur 15 (*) NEGATIVE mg/dL   Protein, ur NEGATIVE  NEGATIVE mg/dL   Urobilinogen, UA 0.2  0.0 - 1.0 mg/dL   Nitrite NEGATIVE  NEGATIVE   Leukocytes, UA SMALL (*) NEGATIVE  URINE MICROSCOPIC-ADD ON   Collection Time    12/10/13 11:35 PM      Result Value Ref Range   Squamous Epithelial / LPF FEW (*) RARE   WBC, UA 7-10  <3 WBC/hpf   RBC / HPF 3-6  <3 RBC/hpf   Bacteria, UA FEW (*) RARE   Imaging Studies:  [place] Ob Follow Up  12/08/2013   OBSTETRICAL ULTRASOUND: This exam was performed within a  Ultrasound Department. The OB US report was generated in the AS system, and faxed to the ordering physician.   This report is available in the [facility] PACS. See the [facility] Obstetric US report via the Image Link.   Assessment: Ronnell FreshwaterMichelle L Masaki is  33 y.o. G2P1001 at 5152w0d presents with preterm contractions. She has been contracting for several hours and is without cervical change from prior exam.  Suspcious for preterm contractions without labor given no cervical change. Will give IV fluids and additional dose of procardia to see if improves symptoms. Pt did not receive steroids this pregnancy. Pt is >34 wks, did not collect FFN and will not give steroids. Reeval after intervention.  Pt improved with fluids and Procardia. Pt stable for discharge to home. Pt with 2 percocet for pain.  Gennie Dib RYAN 7/12/201512:19 AM

## 2013-12-11 NOTE — Discharge Instructions (Signed)

## 2013-12-11 NOTE — MAU Provider Note (Signed)
`````  Attestation of Attending Supervision of Advanced Practitioner: Evaluation and management procedures were performed by the PA/NP/CNM/OB Fellow under my supervision/collaboration. Chart reviewed and agree with management and plan.  Josian Lanese V 12/11/2013 11:50 PM

## 2013-12-22 ENCOUNTER — Ambulatory Visit (INDEPENDENT_AMBULATORY_CARE_PROVIDER_SITE_OTHER): Payer: BC Managed Care – PPO | Admitting: Obstetrics & Gynecology

## 2013-12-22 VITALS — BP 112/70 | HR 117 | Temp 98.6°F | Wt 180.9 lb

## 2013-12-22 DIAGNOSIS — O09299 Supervision of pregnancy with other poor reproductive or obstetric history, unspecified trimester: Secondary | ICD-10-CM

## 2013-12-22 DIAGNOSIS — Z3480 Encounter for supervision of other normal pregnancy, unspecified trimester: Secondary | ICD-10-CM

## 2013-12-22 DIAGNOSIS — Z3493 Encounter for supervision of normal pregnancy, unspecified, third trimester: Secondary | ICD-10-CM

## 2013-12-22 DIAGNOSIS — O09293 Supervision of pregnancy with other poor reproductive or obstetric history, third trimester: Secondary | ICD-10-CM

## 2013-12-22 LAB — POCT URINALYSIS DIP (DEVICE)
Bilirubin Urine: NEGATIVE
Glucose, UA: NEGATIVE mg/dL
Hgb urine dipstick: NEGATIVE
KETONES UR: NEGATIVE mg/dL
Nitrite: NEGATIVE
Protein, ur: NEGATIVE mg/dL
Specific Gravity, Urine: 1.01 (ref 1.005–1.030)
UROBILINOGEN UA: 0.2 mg/dL (ref 0.0–1.0)
pH: 6 (ref 5.0–8.0)

## 2013-12-22 NOTE — Progress Notes (Signed)
Discussed schedule CS vs vaginal birth [facility] dates forceps delivery, pt will consider further but advised she is a candidate for vaginal delivery.

## 2013-12-22 NOTE — Patient Instructions (Signed)

## 2013-12-22 NOTE — Progress Notes (Signed)
Pt stopped taking Procardia at 36 wks as directed, has been having contractions/pressure

## 2013-12-23 ENCOUNTER — Encounter: Payer: BC Managed Care – PPO | Admitting: Obstetrics & Gynecology

## 2013-12-23 LAB — GC/CHLAMYDIA PROBE AMP
CT PROBE, AMP APTIMA: NEGATIVE
GC Probe RNA: NEGATIVE

## 2013-12-25 LAB — CULTURE, BETA STREP (GROUP B ONLY)

## 2013-12-27 ENCOUNTER — Encounter: Payer: Self-pay | Admitting: Obstetrics & Gynecology

## 2013-12-27 ENCOUNTER — Ambulatory Visit (INDEPENDENT_AMBULATORY_CARE_PROVIDER_SITE_OTHER): Payer: BC Managed Care – PPO | Admitting: Obstetrics & Gynecology

## 2013-12-27 VITALS — BP 100/70 | HR 113 | Wt 182.2 lb

## 2013-12-27 DIAGNOSIS — O09299 Supervision of pregnancy with other poor reproductive or obstetric history, unspecified trimester: Secondary | ICD-10-CM

## 2013-12-27 DIAGNOSIS — Z3483 Encounter for supervision of other normal pregnancy, third trimester: Secondary | ICD-10-CM

## 2013-12-27 DIAGNOSIS — O09293 Supervision of pregnancy with other poor reproductive or obstetric history, third trimester: Secondary | ICD-10-CM

## 2013-12-27 DIAGNOSIS — Z3480 Encounter for supervision of other normal pregnancy, unspecified trimester: Secondary | ICD-10-CM

## 2013-12-27 NOTE — Patient Instructions (Signed)
Return to clinic for any obstetric concerns or go to [facility] for evaluation  

## 2013-12-27 NOTE — Progress Notes (Signed)
Patient desires cesarean section and BTS, this will be scheduled at 39 weeks.  She absolutely does not want to attempt vaginal delivery; had a very traumatic experience during last delivery that resulted in a 4th degree laceration. The risks of cesarean section were discussed with the patient including but were not limited to: bleeding which may require transfusion or reoperation; infection which may require antibiotics; injury to bowel, bladder, ureters or other surrounding organs; injury to the fetus; need for additional procedures including hysterectomy in the event of a life-threatening hemorrhage; placental abnormalities wth subsequent pregnancies, incisional problems, thromboembolic phenomenon and other postoperative/anesthesia complications.  Patient also desires permanent sterilization.  Other reversible forms of contraception were discussed with patient; she declines all other modalities. Risks of procedure discussed with patient including but not limited to: risk of regret, permanence of method, bleeding, infection, injury to surrounding organs and need for additional procedures.  Failure risk of 1-2% with increased risk of ectopic gestation if pregnancy occurs was also discussed with patient.  All questions were answered.  She was told that she will be contacted by our surgical scheduler regarding the time and date of her surgery; routine preoperative instructions of having nothing to eat or drink after midnight on the day prior to surgery and also coming to the hospital 1.5 hours prior to her time of surgery were also emphasized.  No other complaints or concerns.  Routine obstetric precautions reviewed.

## 2013-12-29 ENCOUNTER — Encounter: Payer: BC Managed Care – PPO | Admitting: Obstetrics & Gynecology

## 2013-12-29 ENCOUNTER — Encounter: Payer: Self-pay | Admitting: *Deleted

## 2013-12-30 ENCOUNTER — Encounter: Payer: BC Managed Care – PPO | Admitting: Obstetrics & Gynecology

## 2014-01-03 ENCOUNTER — Encounter (HOSPITAL_COMMUNITY): Payer: Self-pay | Admitting: Pharmacist

## 2014-01-04 ENCOUNTER — Encounter: Payer: Self-pay | Admitting: Obstetrics and Gynecology

## 2014-01-04 ENCOUNTER — Ambulatory Visit (INDEPENDENT_AMBULATORY_CARE_PROVIDER_SITE_OTHER): Payer: BC Managed Care – PPO | Admitting: Obstetrics and Gynecology

## 2014-01-04 VITALS — BP 110/65 | HR 104 | Wt 183.6 lb

## 2014-01-04 DIAGNOSIS — Z3480 Encounter for supervision of other normal pregnancy, unspecified trimester: Secondary | ICD-10-CM

## 2014-01-04 DIAGNOSIS — Z3483 Encounter for supervision of other normal pregnancy, third trimester: Secondary | ICD-10-CM

## 2014-01-04 DIAGNOSIS — O09299 Supervision of pregnancy with other poor reproductive or obstetric history, unspecified trimester: Secondary | ICD-10-CM

## 2014-01-04 DIAGNOSIS — O09293 Supervision of pregnancy with other poor reproductive or obstetric history, third trimester: Secondary | ICD-10-CM

## 2014-01-04 NOTE — Progress Notes (Signed)
Patient is doing well without complaints. She is scheduled for c-section on 8/11. Patient is undecided regarding BTS. She will confirm her decision on the day of the procedure. FM/labor precautions reviewed

## 2014-01-06 ENCOUNTER — Encounter (HOSPITAL_COMMUNITY): Payer: Self-pay

## 2014-01-09 ENCOUNTER — Encounter (HOSPITAL_COMMUNITY)
Admission: RE | Admit: 2014-01-09 | Discharge: 2014-01-09 | Disposition: A | Discharge status: Home / Self Care | Payer: BC Managed Care – PPO | Source: Ambulatory Visit | Attending: Obstetrics & Gynecology | Admitting: Obstetrics & Gynecology

## 2014-01-09 ENCOUNTER — Encounter (HOSPITAL_COMMUNITY): Payer: Self-pay

## 2014-01-09 DIAGNOSIS — Z01818 Encounter for other preprocedural examination: Secondary | ICD-10-CM | POA: Insufficient documentation

## 2014-01-09 DIAGNOSIS — Z01812 Encounter for preprocedural laboratory examination: Secondary | ICD-10-CM | POA: Insufficient documentation

## 2014-01-09 LAB — TYPE AND SCREEN
ABO/RH(D): O POS
ANTIBODY SCREEN: NEGATIVE

## 2014-01-09 LAB — CBC
HCT: 35.6 % — ABNORMAL LOW (ref 36.0–46.0)
Hemoglobin: 12 g/dL (ref 12.0–15.0)
MCH: 31.1 pg (ref 26.0–34.0)
MCHC: 33.7 g/dL (ref 30.0–36.0)
MCV: 92.2 fL (ref 78.0–100.0)
PLATELETS: 171 10*3/uL (ref 150–400)
RBC: 3.86 MIL/uL — AB (ref 3.87–5.11)
RDW: 14.9 % (ref 11.5–15.5)
WBC: 10.3 10*3/uL (ref 4.0–10.5)

## 2014-01-09 LAB — ABO/RH: ABO/RH(D): O POS

## 2014-01-09 LAB — RPR

## 2014-01-09 NOTE — Patient Instructions (Signed)
   Your procedure is scheduled on:aug 11 at 945ma  Enter through the Main Entrance of [facility] at:815am Pick up the phone at the desk and dial [phone] and inform us of your arrival.  Please call this number if you have any problems the morning of surgery: [phone]  Remember: Do not eat food after midnight: Do not drink clear liquids after:midnight Take these medicines the morning of surgery with a SIP OF WATER:  Do not wear jewelry, make-up, or FINGER nail polish No metal in your hair or on your body. Do not wear lotions, powders, perfumes.  You may wear deodorant.  Do not bring valuables to the hospital. Contacts, dentures or bridgework may not be worn into surgery.  Leave suitcase in the car. After Surgery it may be brought to your room. For patients being admitted to the hospital, checkout time is 11:00am the day of discharge.    Patients discharged on the day of surgery will not be allowed to drive home.

## 2014-01-10 ENCOUNTER — Encounter (HOSPITAL_COMMUNITY): Payer: BC Managed Care – PPO | Admitting: Anesthesiology

## 2014-01-10 ENCOUNTER — Inpatient Hospital Stay (HOSPITAL_COMMUNITY): Payer: BC Managed Care – PPO | Admitting: Anesthesiology

## 2014-01-10 ENCOUNTER — Encounter (HOSPITAL_COMMUNITY)
Admission: RE | Disposition: A | Discharge status: Home / Self Care | Payer: Self-pay | Source: Ambulatory Visit | Attending: Obstetrics & Gynecology

## 2014-01-10 ENCOUNTER — Inpatient Hospital Stay (HOSPITAL_COMMUNITY)
Admission: AD | Admit: 2014-01-10 | Payer: BC Managed Care – PPO | Source: Ambulatory Visit | Admitting: Obstetrics & Gynecology

## 2014-01-10 ENCOUNTER — Encounter (HOSPITAL_COMMUNITY): Payer: Self-pay | Admitting: Anesthesiology

## 2014-01-10 ENCOUNTER — Inpatient Hospital Stay (HOSPITAL_COMMUNITY)
Admission: RE | Admit: 2014-01-10 | Discharge: 2014-01-12 | DRG: 766 | Disposition: A | Discharge status: Home / Self Care | Payer: BC Managed Care – PPO | Source: Ambulatory Visit | Attending: Obstetrics & Gynecology | Admitting: Obstetrics & Gynecology

## 2014-01-10 DIAGNOSIS — O99892 Other specified diseases and conditions complicating childbirth: Principal | ICD-10-CM | POA: Diagnosis present

## 2014-01-10 DIAGNOSIS — O99891 Other specified diseases and conditions complicating pregnancy: Secondary | ICD-10-CM | POA: Diagnosis present

## 2014-01-10 DIAGNOSIS — O9989 Other specified diseases and conditions complicating pregnancy, childbirth and the puerperium: Secondary | ICD-10-CM

## 2014-01-10 DIAGNOSIS — O09299 Supervision of pregnancy with other poor reproductive or obstetric history, unspecified trimester: Secondary | ICD-10-CM

## 2014-01-10 DIAGNOSIS — I498 Other specified cardiac arrhythmias: Secondary | ICD-10-CM

## 2014-01-10 DIAGNOSIS — Z98891 History of uterine scar from previous surgery: Secondary | ICD-10-CM

## 2014-01-10 SURGERY — Surgical Case
Anesthesia: Spinal | Site: Abdomen

## 2014-01-10 MED ORDER — PRENATAL MULTIVITAMIN CH
1.0000 | ORAL_TABLET | Freq: Every day | ORAL | Status: DC
  Administered 2014-01-11 – 2014-01-12 (×2): 1 via ORAL
  Filled 2014-01-10 (×2): qty 1

## 2014-01-10 MED ORDER — DEXTROSE 5 % IV SOLN
2.0000 g | INTRAVENOUS | Status: AC
  Administered 2014-01-10: 2 g via INTRAVENOUS
  Filled 2014-01-10: qty 2

## 2014-01-10 MED ORDER — BUPIVACAINE IN DEXTROSE 0.75-8.25 % IT SOLN
INTRATHECAL | Status: DC | PRN
  Administered 2014-01-10: 1.5 mL via INTRATHECAL

## 2014-01-10 MED ORDER — MORPHINE SULFATE (PF) 0.5 MG/ML IJ SOLN
INTRAMUSCULAR | Status: DC | PRN
  Administered 2014-01-10: .2 mg via INTRATHECAL

## 2014-01-10 MED ORDER — IBUPROFEN 600 MG PO TABS: 600.0000 mg | ORAL_TABLET | Freq: Four times a day (QID) | ORAL | Status: DC | PRN

## 2014-01-10 MED ORDER — HYDROMORPHONE HCL PF 1 MG/ML IJ SOLN
INTRAMUSCULAR | Status: AC
  Administered 2014-01-10: 0.5 mg via INTRAVENOUS
  Filled 2014-01-10: qty 1

## 2014-01-10 MED ORDER — NALOXONE HCL 0.4 MG/ML IJ SOLN
0.4000 mg | INTRAMUSCULAR | Status: DC | PRN
Start: 2014-01-10 — End: 2014-01-12

## 2014-01-10 MED ORDER — PHENYLEPHRINE 8 MG IN D5W 100 ML (0.08MG/ML) PREMIX OPTIME
INJECTION | INTRAVENOUS | Status: AC
  Filled 2014-01-10: qty 100

## 2014-01-10 MED ORDER — OXYTOCIN 40 UNITS IN LACTATED RINGERS INFUSION - SIMPLE MED: 62.5000 mL/h | INTRAVENOUS | Status: AC

## 2014-01-10 MED ORDER — SENNOSIDES-DOCUSATE SODIUM 8.6-50 MG PO TABS
2.0000 | ORAL_TABLET | ORAL | Status: DC
  Administered 2014-01-11 (×2): 2 via ORAL
  Filled 2014-01-10 (×2): qty 2

## 2014-01-10 MED ORDER — SCOPOLAMINE 1 MG/3DAYS TD PT72
MEDICATED_PATCH | TRANSDERMAL | Status: AC
  Administered 2014-01-10: 1.5 mg via TRANSDERMAL
  Filled 2014-01-10: qty 1

## 2014-01-10 MED ORDER — DIPHENHYDRAMINE HCL 25 MG PO CAPS: 25.0000 mg | ORAL_CAPSULE | ORAL | Status: DC | PRN

## 2014-01-10 MED ORDER — KETOROLAC TROMETHAMINE 30 MG/ML IJ SOLN
30.0000 mg | Freq: Four times a day (QID) | INTRAMUSCULAR | Status: AC | PRN
  Filled 2014-01-10: qty 1

## 2014-01-10 MED ORDER — ONDANSETRON HCL 4 MG PO TABS: 4.0000 mg | ORAL_TABLET | ORAL | Status: DC | PRN

## 2014-01-10 MED ORDER — MEPERIDINE HCL 25 MG/ML IJ SOLN: 6.2500 mg | INTRAMUSCULAR | Status: DC | PRN

## 2014-01-10 MED ORDER — LACTATED RINGERS IV SOLN
INTRAVENOUS | Status: DC
  Administered 2014-01-10: 17:00:00 via INTRAVENOUS

## 2014-01-10 MED ORDER — MORPHINE SULFATE (PF) 0.5 MG/ML IJ SOLN
INTRAMUSCULAR | Status: DC | PRN
  Administered 2014-01-10: .3 mg via EPIDURAL
  Administered 2014-01-10: .5 mg via INTRAVENOUS
  Administered 2014-01-10 (×2): .5 mg via EPIDURAL

## 2014-01-10 MED ORDER — DIPHENHYDRAMINE HCL 50 MG/ML IJ SOLN: 25.0000 mg | INTRAMUSCULAR | Status: DC | PRN

## 2014-01-10 MED ORDER — PHENYLEPHRINE 8 MG IN D5W 100 ML (0.08MG/ML) PREMIX OPTIME
INJECTION | INTRAVENOUS | Status: DC | PRN
  Administered 2014-01-10: 60 ug/min via INTRAVENOUS

## 2014-01-10 MED ORDER — WITCH HAZEL-GLYCERIN EX PADS: 1.0000 "application " | MEDICATED_PAD | CUTANEOUS | Status: DC | PRN

## 2014-01-10 MED ORDER — KETOROLAC TROMETHAMINE 30 MG/ML IJ SOLN
INTRAMUSCULAR | Status: AC
  Filled 2014-01-10: qty 1

## 2014-01-10 MED ORDER — BUPIVACAINE HCL (PF) 0.5 % IJ SOLN
INTRAMUSCULAR | Status: DC | PRN
  Administered 2014-01-10: 30 mL

## 2014-01-10 MED ORDER — PROMETHAZINE HCL 25 MG/ML IJ SOLN: 6.2500 mg | INTRAMUSCULAR | Status: DC | PRN

## 2014-01-10 MED ORDER — SIMETHICONE 80 MG PO CHEW
80.0000 mg | CHEWABLE_TABLET | ORAL | Status: DC | PRN
  Filled 2014-01-10: qty 1

## 2014-01-10 MED ORDER — BUPIVACAINE HCL (PF) 0.5 % IJ SOLN
INTRAMUSCULAR | Status: AC
  Filled 2014-01-10: qty 30

## 2014-01-10 MED ORDER — SIMETHICONE 80 MG PO CHEW
80.0000 mg | CHEWABLE_TABLET | Freq: Three times a day (TID) | ORAL | Status: DC
  Administered 2014-01-10 – 2014-01-12 (×6): 80 mg via ORAL
  Filled 2014-01-10 (×7): qty 1

## 2014-01-10 MED ORDER — FENTANYL CITRATE 0.05 MG/ML IJ SOLN
INTRAMUSCULAR | Status: AC
  Filled 2014-01-10: qty 2

## 2014-01-10 MED ORDER — NALOXONE HCL 1 MG/ML IJ SOLN
1.0000 ug/kg/h | INTRAMUSCULAR | Status: DC | PRN
  Filled 2014-01-10: qty 2

## 2014-01-10 MED ORDER — ONDANSETRON HCL 4 MG/2ML IJ SOLN
INTRAMUSCULAR | Status: AC
  Filled 2014-01-10: qty 2

## 2014-01-10 MED ORDER — LACTATED RINGERS IV SOLN
INTRAVENOUS | Status: DC
  Administered 2014-01-10 (×2): via INTRAVENOUS

## 2014-01-10 MED ORDER — SODIUM CHLORIDE 0.9 % IJ SOLN: 3.0000 mL | INTRAMUSCULAR | Status: DC | PRN

## 2014-01-10 MED ORDER — MENTHOL 3 MG MT LOZG: 1.0000 | LOZENGE | OROMUCOSAL | Status: DC | PRN

## 2014-01-10 MED ORDER — IBUPROFEN 600 MG PO TABS
600.0000 mg | ORAL_TABLET | Freq: Four times a day (QID) | ORAL | Status: DC
  Administered 2014-01-11 – 2014-01-12 (×6): 600 mg via ORAL
  Filled 2014-01-10 (×7): qty 1

## 2014-01-10 MED ORDER — TETANUS-DIPHTH-ACELL PERTUSSIS 5-2.5-18.5 LF-MCG/0.5 IM SUSP: 0.5000 mL | Freq: Once | INTRAMUSCULAR | Status: DC

## 2014-01-10 MED ORDER — FENTANYL CITRATE 0.05 MG/ML IJ SOLN
INTRAMUSCULAR | Status: DC | PRN
  Administered 2014-01-10: 50 ug via INTRAVENOUS
  Administered 2014-01-10: 12.5 ug via INTRATHECAL
  Administered 2014-01-10: 25 ug via INTRAVENOUS
  Administered 2014-01-10: 12.5 ug via INTRAVENOUS

## 2014-01-10 MED ORDER — LANOLIN HYDROUS EX OINT: 1.0000 "application " | TOPICAL_OINTMENT | CUTANEOUS | Status: DC | PRN

## 2014-01-10 MED ORDER — OXYCODONE-ACETAMINOPHEN 5-325 MG PO TABS
1.0000 | ORAL_TABLET | ORAL | Status: DC | PRN
  Administered 2014-01-11 (×2): 1 via ORAL
  Administered 2014-01-11 – 2014-01-12 (×5): 2 via ORAL
  Filled 2014-01-10: qty 1
  Filled 2014-01-10 (×4): qty 2
  Filled 2014-01-10: qty 1
  Filled 2014-01-10: qty 2

## 2014-01-10 MED ORDER — OXYTOCIN 10 UNIT/ML IJ SOLN
INTRAMUSCULAR | Status: AC
  Filled 2014-01-10: qty 4

## 2014-01-10 MED ORDER — METOCLOPRAMIDE HCL 5 MG/ML IJ SOLN: 10.0000 mg | Freq: Three times a day (TID) | INTRAMUSCULAR | Status: DC | PRN

## 2014-01-10 MED ORDER — ZOLPIDEM TARTRATE 5 MG PO TABS: 5.0000 mg | ORAL_TABLET | Freq: Every evening | ORAL | Status: DC | PRN

## 2014-01-10 MED ORDER — DIPHENHYDRAMINE HCL 25 MG PO CAPS
25.0000 mg | ORAL_CAPSULE | Freq: Four times a day (QID) | ORAL | Status: DC | PRN
Start: 2014-01-10 — End: 2014-01-12

## 2014-01-10 MED ORDER — OXYTOCIN 40 UNITS IN LACTATED RINGERS INFUSION - SIMPLE MED
INTRAVENOUS | Status: DC | PRN
  Administered 2014-01-10: 40 [IU] via INTRAVENOUS

## 2014-01-10 MED ORDER — SCOPOLAMINE 1 MG/3DAYS TD PT72
1.0000 | MEDICATED_PATCH | Freq: Once | TRANSDERMAL | Status: DC
  Administered 2014-01-10: 1.5 mg via TRANSDERMAL

## 2014-01-10 MED ORDER — SIMETHICONE 80 MG PO CHEW
80.0000 mg | CHEWABLE_TABLET | ORAL | Status: DC
  Administered 2014-01-11 (×2): 80 mg via ORAL
  Filled 2014-01-10 (×2): qty 1

## 2014-01-10 MED ORDER — KETOROLAC TROMETHAMINE 30 MG/ML IJ SOLN
30.0000 mg | Freq: Four times a day (QID) | INTRAMUSCULAR | Status: AC | PRN
  Administered 2014-01-10 (×2): 30 mg via INTRAVENOUS
  Filled 2014-01-10: qty 1

## 2014-01-10 MED ORDER — ONDANSETRON HCL 4 MG/2ML IJ SOLN: 4.0000 mg | Freq: Three times a day (TID) | INTRAMUSCULAR | Status: DC | PRN

## 2014-01-10 MED ORDER — NALBUPHINE HCL 10 MG/ML IJ SOLN: 5.0000 mg | INTRAMUSCULAR | Status: DC | PRN

## 2014-01-10 MED ORDER — HYDROMORPHONE HCL PF 1 MG/ML IJ SOLN
INTRAMUSCULAR | Status: AC
  Filled 2014-01-10: qty 1

## 2014-01-10 MED ORDER — HYDROMORPHONE HCL PF 1 MG/ML IJ SOLN
0.2500 mg | INTRAMUSCULAR | Status: DC | PRN
  Administered 2014-01-10 (×4): 0.5 mg via INTRAVENOUS

## 2014-01-10 MED ORDER — DIPHENHYDRAMINE HCL 50 MG/ML IJ SOLN: 12.5000 mg | INTRAMUSCULAR | Status: DC | PRN

## 2014-01-10 MED ORDER — KETOROLAC TROMETHAMINE 30 MG/ML IJ SOLN: 15.0000 mg | Freq: Once | INTRAMUSCULAR | Status: DC | PRN

## 2014-01-10 MED ORDER — DIBUCAINE 1 % RE OINT: 1.0000 "application " | TOPICAL_OINTMENT | RECTAL | Status: DC | PRN

## 2014-01-10 MED ORDER — MORPHINE SULFATE 0.5 MG/ML IJ SOLN
INTRAMUSCULAR | Status: AC
  Filled 2014-01-10: qty 10

## 2014-01-10 MED ORDER — LACTATED RINGERS IV SOLN
Freq: Once | INTRAVENOUS | Status: AC
  Administered 2014-01-10: 10:00:00 via INTRAVENOUS

## 2014-01-10 MED ORDER — ONDANSETRON HCL 4 MG/2ML IJ SOLN
4.0000 mg | INTRAMUSCULAR | Status: DC | PRN
Start: 2014-01-10 — End: 2014-01-12

## 2014-01-10 MED ORDER — ONDANSETRON HCL 4 MG/2ML IJ SOLN
INTRAMUSCULAR | Status: DC | PRN
  Administered 2014-01-10: 4 mg via INTRAVENOUS

## 2014-01-10 SURGICAL SUPPLY — 36 items
BLADE SURG 10 STRL SS (BLADE) ×6 IMPLANT
CLAMP CORD UMBIL (MISCELLANEOUS) ×2 IMPLANT
CLOTH BEACON ORANGE TIMEOUT ST (SAFETY) ×3 IMPLANT
DRAPE LG THREE QUARTER DISP (DRAPES) ×2 IMPLANT
DRSG OPSITE POSTOP 4X10 (GAUZE/BANDAGES/DRESSINGS) ×3 IMPLANT
DURAPREP 26ML APPLICATOR (WOUND CARE) ×3 IMPLANT
ELECT REM PT RETURN 9FT ADLT (ELECTROSURGICAL) ×3
ELECTRODE REM PT RTRN 9FT ADLT (ELECTROSURGICAL) ×2 IMPLANT
EXTRACTOR VACUUM M CUP 4 TUBE (SUCTIONS) IMPLANT
GLOVE BIOGEL PI IND STRL 7.0 (GLOVE) ×2 IMPLANT
GLOVE BIOGEL PI INDICATOR 7.0 (GLOVE) ×1
GLOVE ECLIPSE 7.0 STRL STRAW (GLOVE) ×3 IMPLANT
GOWN STRL NON-REIN LRG LVL3 (GOWN DISPOSABLE) ×3 IMPLANT
GOWN STRL REUS W/TWL LRG LVL3 (GOWN DISPOSABLE) ×6 IMPLANT
HEMOSTAT SURGICEL 2X3 (HEMOSTASIS) ×2 IMPLANT
KIT ABG SYR 3ML LUER SLIP (SYRINGE) IMPLANT
NDL HYPO 25X5/8 SAFETYGLIDE (NEEDLE) ×1 IMPLANT
NEEDLE HYPO 22GX1.5 SAFETY (NEEDLE) ×3 IMPLANT
NEEDLE HYPO 25X5/8 SAFETYGLIDE (NEEDLE) ×3 IMPLANT
NS IRRIG 1000ML POUR BTL (IV SOLUTION) ×3 IMPLANT
PACK C SECTION WH (CUSTOM PROCEDURE TRAY) ×3 IMPLANT
PAD ABD 7.5X8 STRL (GAUZE/BANDAGES/DRESSINGS) ×3 IMPLANT
PAD OB MATERNITY 4.3X12.25 (PERSONAL CARE ITEMS) ×3 IMPLANT
RTRCTR C-SECT PINK 25CM LRG (MISCELLANEOUS) ×2 IMPLANT
STAPLER VISISTAT 35W (STAPLE) IMPLANT
SUT PDS AB 0 CTX 36 PDP370T (SUTURE) ×2 IMPLANT
SUT PLAIN 2 0 XLH (SUTURE) ×2 IMPLANT
SUT VIC AB 0 CT1 36 (SUTURE) ×6 IMPLANT
SUT VIC AB 0 CTX 36 (SUTURE) ×6
SUT VIC AB 0 CTX36XBRD ANBCTRL (SUTURE) ×4 IMPLANT
SUT VIC AB 4-0 KS 27 (SUTURE) ×3 IMPLANT
SYRINGE CONTROL L 12CC (SYRINGE) ×3 IMPLANT
SYRINGE CONTROL LL 12CC (SYRINGE) ×1 IMPLANT
TOWEL OR 17X24 6PK STRL BLUE (TOWEL DISPOSABLE) ×3 IMPLANT
TRAY FOLEY CATH 14FR (SET/KITS/TRAYS/PACK) ×3 IMPLANT
WATER STERILE IRR 1000ML POUR (IV SOLUTION) ×3 IMPLANT

## 2014-01-10 NOTE — Anesthesia Preprocedure Evaluation (Signed)
Anesthesia Evaluation  Patient identified by MRN, date of birth, ID band Patient awake    Reviewed: Allergy & Precautions, H&P , NPO status , Patient's Chart, lab work & pertinent test results  Airway Mallampati: I TM Distance: >3 FB Neck ROM: full    Dental no notable dental hx. (+) Poor Dentition   Pulmonary neg pulmonary ROS,    Pulmonary exam normal       Cardiovascular negative cardio ROS      Neuro/Psych negative psych ROS   GI/Hepatic negative GI ROS, Neg liver ROS,   Endo/Other  negative endocrine ROS  Renal/GU negative Renal ROS     Musculoskeletal   Abdominal Normal abdominal exam  (+)   Peds  Hematology negative hematology ROS (+)   Anesthesia Other Findings   Reproductive/Obstetrics (+) Pregnancy                           Anesthesia Physical Anesthesia Plan  ASA: II  Anesthesia Plan: Spinal   Post-op Pain Management:    Induction:   Airway Management Planned:   Additional Equipment:   Intra-op Plan:   Post-operative Plan:   Informed Consent: I have reviewed the patients History and Physical, chart, labs and discussed the procedure including the risks, benefits and alternatives for the proposed anesthesia with the patient or authorized representative who has indicated his/her understanding and acceptance.     Plan Discussed with: CRNA and Surgeon  Anesthesia Plan Comments:         Anesthesia Quick Evaluation

## 2014-01-10 NOTE — Anesthesia Postprocedure Evaluation (Signed)
Anesthesia Post Note  Patient: Kayla Patterson  Procedure(s) Performed: Procedure(s) (LRB): PRIMARY CESAREAN SECTION (Bilateral)  Anesthesia type: Spinal  Patient location: PACU  Post pain: Pain level controlled  Post assessment: Post-op Vital signs reviewed  Last Vitals:  Filed Vitals:   01/10/14 1138  BP:   Pulse: 93  Temp:   Resp: 18    Post vital signs: Reviewed  Level of consciousness: awake  Complications: No apparent anesthesia complications

## 2014-01-10 NOTE — Op Note (Signed)
Kayla Patterson PROCEDURE DATE: 01/10/2014  PREOPERATIVE DIAGNOSES: Intrauterine pregnancy at 3560w2d weeks gestation; patient declines vag del attempt 2/2 to history of 4th degree laceration  POSTOPERATIVE DIAGNOSES: The same  PROCEDURE: Primary Low Transverse Cesarean Section  SURGEON:  Dr. Jaynie CollinsUgonna Anyanwu  ASSISTANT:  Fredirick LatheKristy Roan Sawchuk, MD  INDICATIONS: Kayla Patterson is a 33 y.o. G3P1011 at 6460w2d here for cesarean section secondary to the indications listed under preoperative diagnoses; please see preoperative note for further details.  The risks of cesarean section were discussed with the patient including but were not limited to: bleeding which may require transfusion or reoperation; infection which may require antibiotics; injury to bowel, bladder, ureters or other surrounding organs; injury to the fetus; need for additional procedures including hysterectomy in the event of a life-threatening hemorrhage; placental abnormalities wth subsequent pregnancies, incisional problems, thromboembolic phenomenon and other postoperative/anesthesia complications.   The patient concurred with the proposed plan, giving informed written consent for the procedure.    FINDINGS:  Viable female infant in cephalic presentation.  Apgars 8 and 9.  Clear amniotic fluid.  Intact placenta, three vessel cord.  Normal uterus, fallopian tubes and ovaries bilaterally.  ANESTHESIA: Spinal INTRAVENOUS FLUIDS: 3000 ml ESTIMATED BLOOD LOSS: 1500 ml URINE OUTPUT:  300 ml SPECIMENS: Placenta sent to L&D COMPLICATIONS: None immediate  PROCEDURE IN DETAIL:  The patient preoperatively received intravenous antibiotics and had sequential compression devices applied to her lower extremities.  She was then taken to the operating room where spinal anesthesia was administered and was found to be adequate. She was then placed in a dorsal supine position with a leftward tilt, and prepped and draped in a sterile manner.  A foley catheter was  placed into her bladder and attached to constant gravity.  After an adequate timeout was performed, a Pfannenstiel skin incision was made with scalpel and carried through to the underlying layer of fascia. The fascia was incised in the midline, and this incision was extended bilaterally using the Mayo scissors.  Kocher clamps were applied to the superior aspect of the fascial incision and the underlying rectus muscles were dissected off bluntly. A similar process was carried out on the inferior aspect of the fascial incision. The rectus muscles were separated in the midline bluntly and the peritoneum was entered bluntly. Attention was turned to the lower uterine segment where a large venous sinus encompassed the entire segment, a low transverse hysterotomy was made with a scalpel and extended bilaterally bluntly.  The infant was successfully delivered, the cord was clamped and cut and the infant was handed over to awaiting neonatology team. Uterine massage was then administered, and the placenta delivered intact with a three-vessel cord. The uterus was then cleared of clot and debris.  The hysterotomy was closed with 0 Vicryl in a running locked fashion, and an imbricating layer was also placed with 0 Vicryl. The pelvis was cleared of all clot and debris. Hemostasis was confirmed on all surfaces.  The peritoneum and the muscles were reapproximated using 0 Vicryl running sutures. The fascia was then closed using 0 Vicryl n a running fashion.  The subcutaneous layer was irrigated, and 30 ml of 0.5% Marcaine was injected subcutaneously around the incision.  The skin was closed with a 4-0 Vicryl subcuticular stitch. The patient tolerated the procedure well. Sponge, lap, instrument and needle counts were correct x 2.  She was taken to the recovery room in stable condition.

## 2014-01-10 NOTE — Op Note (Signed)
Attestation of Attending Supervision of Obstetric Fellow During Surgery: Surgery was performed by the Obstetric Fellow under my supervision and collaboration.  I have reviewed the Obstetric Fellow's operative report, and I agree with the documentation.  I was present and scrubbed for the entire procedure.    Jaynie CollinsUGONNA  Dessire Grimes, MD, FACOG Attending Obstetrician & Gynecologist Faculty Practice, [facility] [place] [facility]

## 2014-01-10 NOTE — Transfer of Care (Signed)
Immediate Anesthesia Transfer of Care Note  Patient: Kayla Patterson  Procedure(s) Performed: Procedure(s): PRIMARY CESAREAN SECTION (Bilateral)  Patient Location: PACU  Anesthesia Type:Spinal  Level of Consciousness: awake, alert  and oriented  Airway & Oxygen Therapy: Patient Spontanous Breathing  Post-op Assessment: Report given to PACU RN and Post -op Vital signs reviewed and stable  Post vital signs: Reviewed and stable  Complications: No apparent anesthesia complications

## 2014-01-10 NOTE — Lactation Note (Signed)
This note was copied from the chart of Kayla Prescott ParmaMichelle Fano. Lactation Consultation Note; Asked by RN to assist this mom. Her nipples are flat and baby is very fussy. Assisted with latch- reviewed sandwiching the breast,  holding the breast throughout the feeding, Baby latched well after a few attempts, some swallows noted. Baby nursed on both breasts for 35 min total. BF brochure given with resources for support after DC. Encouraged to call for assist prn  Patient Name: Kayla Prescott ParmaMichelle Anstead ZOXWR'UToday's Date: 01/10/2014 Reason for consult: Initial assessment   Maternal Data Formula Feeding for Exclusion: No Does the patient have breastfeeding experience prior to this delivery?: Yes  Feeding Feeding Type: Breast Fed Length of feed: 35 min  LATCH Score/Interventions Latch: Grasps breast easily, tongue down, lips flanged, rhythmical sucking.  Audible Swallowing: A few with stimulation  Type of Nipple: Flat  Comfort (Breast/Nipple): Soft / non-tender     Hold (Positioning): Assistance needed to correctly position infant at breast and maintain latch. Intervention(s): Breastfeeding basics reviewed;Support Pillows;Skin to skin;Position options  LATCH Score: 7  Lactation Tools Discussed/Used     Consult Status Consult Status: Follow-up Date: 01/04/14 Follow-up type: In-patient    Pamelia HoitWeeks, Finnley Lewis D 01/10/2014, 3:08 PM

## 2014-01-10 NOTE — Addendum Note (Signed)
Addendum created 01/10/14 1624 by Turner DanielsJennifer L Kimberlye Dilger, CRNA   Modules edited: Notes Section   Notes Section:  File: [id number]

## 2014-01-10 NOTE — Anesthesia Procedure Notes (Signed)
Spinal  Patient location during procedure: OR Start time: 01/10/2014 9:37 AM End time: 01/10/2014 9:40 AM Staffing Anesthesiologist: Leilani AbleHATCHETT, Denis Koppel Performed by: anesthesiologist  Preanesthetic Checklist Completed: patient identified, surgical consent, pre-op evaluation, timeout performed, IV checked, risks and benefits discussed and monitors and equipment checked Spinal Block Patient position: sitting Prep: DuraPrep Patient monitoring: heart rate, cardiac monitor, continuous pulse ox and blood pressure Approach: midline Location: L3-4 Injection technique: single-shot Needle Needle type: Pencan  Needle gauge: 24 G Needle length: 9 cm Needle insertion depth: 5 cm Assessment Sensory level: T4

## 2014-01-10 NOTE — Progress Notes (Signed)
Faculty Practice OB/GYN Attending Note  Subjective:  Came to evaluate patient in PACU.  She reports incisional pain, no other symptoms.   Objective:  Blood pressure 98/50, pulse 93, temperature 98.3 F (36.8 C), temperature source Oral, resp. rate 18, last menstrual period 04/14/2013, SpO2 99.00%. Gen: NAD Lungs: CTAB Abdomen: appropriately tender, soft, nondistended Incision: Moderately saturated Honeycomb dressing. Dressing removed, new pressure dressing placed. Ext: SCDs in place  Assessment & Plan:  33 y.o. [id number] s/p cesarean section with EBL 1500 ml. Stable vital signs and abdominal exam; incisional dressing changed. No signs of ongoing bleeding.  Will continue close observation, transfer to postpartum floor if remains stable.   Jaynie CollinsUGONNA  ANYANWU, MD, FACOG Attending Obstetrician & Gynecologist Faculty Practice, [facility] [place]

## 2014-01-10 NOTE — Anesthesia Postprocedure Evaluation (Signed)
  Anesthesia Post-op Note  Anesthesia Post Note  Patient: Kayla Patterson  Procedure(s) Performed: Procedure(s) (LRB): PRIMARY CESAREAN SECTION (Bilateral)  Anesthesia type: Spinal  Patient location: Mother/Baby  Post pain: Pain level controlled  Post assessment: Post-op Vital signs reviewed  Last Vitals:  Filed Vitals:   01/10/14 1600  BP: 103/56  Pulse: 90  Temp: 37.1 C  Resp: 18    Post vital signs: Reviewed  Level of consciousness: awake  Complications: No apparent anesthesia complications

## 2014-01-10 NOTE — H&P (Signed)
Obstetric Preoperative History and Physical  Kayla Patterson is a 33 y.o. G3P1011 with IUP at [redacted]w[redacted]d presenting for presenting for scheduled cesarean section for history of traumatic fourth degree laceration sustained during last delivery.  Please see previous prenatal notes for further details. No acute concerns.   Prenatal Course Source of Care: [place] initially then transfer to [facility] at 29 weeks Pregnancy complications or risks: Patient Active Problem List   Diagnosis Date Noted  . H/O maternal fourth degree perineal laceration, currently pregnant 12/22/2013  . Supervision of other normal pregnancy 11/07/2013  . Sinus tachycardia 11/07/2013   She plans to breastfeed She desires IUD for postpartum contraception.   Prenatal labs and studies: ABO, Rh: --/--/O POS, O POS (08/10 1040) Antibody: NEG (08/10 1040) Rubella: Immune (02/10 0000) RPR: NON REAC (08/10 1040)  HBsAg: Negative (02/10 0000)  HIV: NONREACTIVE (06/11 1147)  GBS: Negative 1 hr Glucola  137, normal 3 hr GTT Genetic screening normal Anatomy US normal  Prenatal Transfer Tool  Maternal Diabetes: No Genetic Screening: Normal Maternal Ultrasounds/Referrals: Normal Fetal Ultrasounds or other Referrals:  None Maternal Substance Abuse:  No Significant Maternal Medications:  None Significant Maternal Lab Results: Lab values include: Group B Strep negative  Past Medical History  Diagnosis Date  . Migraine   . SAB (spontaneous abortion) 12/2005    7 wks  . SVD (spontaneous vaginal delivery) 03/31/2004    Past Surgical History  Procedure Laterality Date  . Wisdom tooth extraction      OB History  Gravida Para Term Preterm AB SAB TAB Ectopic Multiple Living  3 1 1  0 1 1 0 0 0 1    # Outcome Date GA Lbr Len/2nd Weight Sex Delivery Anes PTL Lv  3 CUR           2 SAB 12/2005 [redacted]w[redacted]d         1 TRM 03/31/04 [redacted]w[redacted]d 21:00 8 lb 7 oz (3.827 kg) M SVD Spinal  Y     Comments: 4th degree laceration      History    Social History  . Marital Status: Married    Spouse Name: N/A    Number of Children: N/A  . Years of Education: N/A   Social History Main Topics  . Smoking status: Never Smoker   . Smokeless tobacco: Never Used  . Alcohol Use: No  . Drug Use: No  . Sexual Activity: Yes    Birth Control/ Protection: None   Other Topics Concern  . None   Social History Narrative  . None    Family History  Problem Relation Age of Onset  . Cancer Mother     Non-Hodgkins Lymphoma    Prescriptions prior to admission  Medication Sig Dispense Refill  . docusate sodium (COLACE) 100 MG capsule Take 100 mg by mouth 3 (three) times daily.      . ferrous sulfate 325 (65 FE) MG tablet Take 325 mg by mouth daily with breakfast.      . Prenatal Vit-Fe Fumarate-FA (PRENATAL MULTIVITAMIN) TABS tablet Take 1 tablet by mouth daily at 12 noon.        No Known Allergies  Review of Systems: Negative except for what is mentioned in HPI.  Physical Exam: BP 122/80  Pulse 111  Temp(Src) 98.6 F (37 C) (Oral)  Resp 18  SpO2 100%  LMP 04/14/2013 FHR by Doppler: 135 bpm GENERAL: Well-developed, well-nourished female in no acute distress.  LUNGS: Clear to auscultation bilaterally.  HEART: Regular rate  and rhythm. ABDOMEN: Soft, nontender, nondistended, gravid PELVIC: Deferred EXTREMITIES: Nontender, no edema, 2+ distal pulses.   Pertinent Labs/Studies:   Results for orders placed during the hospital encounter of 01/09/14 (from the past 72 hour(s))  CBC     Status: Abnormal   Collection Time    01/09/14 10:40 AM      Result Value Ref Range   WBC 10.3  4.0 - 10.5 K/uL   RBC 3.86 (*) 3.87 - 5.11 MIL/uL   Hemoglobin 12.0  12.0 - 15.0 g/dL   HCT 19.135.6 (*) 47.836.0 - 29.546.0 %   MCV 92.2  78.0 - 100.0 fL   MCH 31.1  26.0 - 34.0 pg   MCHC 33.7  30.0 - 36.0 g/dL   RDW 62.114.9  30.811.5 - 65.715.5 %   Platelets 171  150 - 400 K/uL  RPR     Status: None   Collection Time    01/09/14 10:40 AM      Result Value Ref  Range   RPR NON REAC  NON REAC   Comment: Performed at [facility]  TYPE AND SCREEN     Status: None   Collection Time    01/09/14 10:40 AM      Result Value Ref Range   ABO/RH(D) O POS     Antibody Screen NEG     Sample Expiration 01/12/2014    ABO/RH     Status: None   Collection Time    01/09/14 10:40 AM      Result Value Ref Range   ABO/RH(D) O POS      Assessment and Plan :Kayla ParmaMichelle Patterson is a 33 y.o. G3P1011 at 2121w2d being admitted being admitted for scheduled cesarean section. The risks of cesarean section discussed with the patient included but were not limited to: bleeding which may require transfusion or reoperation; infection which may require antibiotics; injury to bowel, bladder, ureters or other surrounding organs; injury to the fetus; need for additional procedures including hysterectomy in the event of a life-threatening hemorrhage; placental abnormalities wth subsequent pregnancies, incisional problems, thromboembolic phenomenon and other postoperative/anesthesia complications. The patient concurred with the proposed plan, giving informed written consent for the procedure. Patient has been NPO since last night she will remain NPO for procedure. Anesthesia and OR aware. Preoperative prophylactic antibiotics and SCDs ordered on call to the OR. To OR when ready.    Jaynie CollinsUGONNA Marlette Curvin, MD, FACOG  Attending Obstetrician & Gynecologist  Faculty Practice, [facility] [place]

## 2014-01-10 NOTE — Lactation Note (Signed)
This note was copied from the chart of Kayla Prescott ParmaMichelle Witherow. Lactation Consultation Note RN called for LC d/t mom's nipple cherry red, cracked, and bruised. Mom's nipples are flat when compressed, can roll only small portion of nipples in finger tips, no shaft to nipple. Fitted w/#20 NS to Rt. Nipple and #24 NS to Lt. Nipple. Comfort gels given, shell to wear in am after sleeping in bra to help w/flat nipples. Hand pump given to pre-pump to assist in pulling nipple out. Baby is sleepy, instructed to pump to stimulate the breast. Mom having incision pain, Football position encouraged to keep baby off of abd. Mom stated that her 1st. Child had a tight frenulum, this baby has good tongue movement and no upper or lower labial frenulum noted. Does have high palate.Dad at bedside is supportive. Baby only did a few sucks at breast using NS. Encouraged STS.  Patient Name: Kayla Patterson Kayla Patterson's Date: 01/10/2014 Reason for consult: Follow-up assessment;Difficult latch   Maternal Data Has patient been taught Hand Expression?: Yes  Feeding Feeding Type: Breast Fed Length of feed: 2 min  LATCH Score/Interventions Latch: Too sleepy or reluctant, no latch achieved, no sucking elicited. Intervention(s): Skin to skin;Teach feeding cues;Waking techniques Intervention(s): Adjust position;Assist with latch;Breast massage;Breast compression  Audible Swallowing: None Intervention(s): Skin to skin;Hand expression  Type of Nipple: Flat Intervention(s): Reverse pressure;Shells;Hand pump  Comfort (Breast/Nipple): Filling, red/small blisters or bruises, mild/mod discomfort Problem noted: Cracked, bleeding, blisters, bruises Intervention(s): Expressed breast milk to nipple;Reverse pressure;Hand pump     Hold (Positioning): Assistance needed to correctly position infant at breast and maintain latch. Intervention(s): Breastfeeding basics reviewed;Support Pillows;Position options;Skin to skin  LATCH Score:  3  Lactation Tools Discussed/Used Tools: Shells;Nipple Shields;Pump;Comfort gels Nipple shield size: 20;24 Shell Type: Inverted Breast pump type: Manual Pump Review: Setup, frequency, and cleaning;Milk Storage Initiated by:: Peri JeffersonL. Emilya Justen RN Date initiated:: 01/10/14   Consult Status Consult Status: Follow-up Date: 01/10/14 Follow-up type: In-patient    Charyl DancerCARVER, Hiyab Nhem G 01/10/2014, 10:50 PM

## 2014-01-11 ENCOUNTER — Encounter (HOSPITAL_COMMUNITY): Payer: Self-pay | Admitting: *Deleted

## 2014-01-11 LAB — CBC
HEMATOCRIT: 24.9 % — AB (ref 36.0–46.0)
Hemoglobin: 8.3 g/dL — ABNORMAL LOW (ref 12.0–15.0)
MCH: 30.7 pg (ref 26.0–34.0)
MCHC: 33.3 g/dL (ref 30.0–36.0)
MCV: 92.2 fL (ref 78.0–100.0)
Platelets: 152 10*3/uL (ref 150–400)
RBC: 2.7 MIL/uL — AB (ref 3.87–5.11)
RDW: 14.9 % (ref 11.5–15.5)
WBC: 12.1 10*3/uL — AB (ref 4.0–10.5)

## 2014-01-11 LAB — BIRTH TISSUE RECOVERY COLLECTION (PLACENTA DONATION)

## 2014-01-11 MED ORDER — FERROUS SULFATE 325 (65 FE) MG PO TABS
325.0000 mg | ORAL_TABLET | Freq: Two times a day (BID) | ORAL | Status: DC
  Administered 2014-01-11 – 2014-01-12 (×3): 325 mg via ORAL
  Filled 2014-01-11 (×3): qty 1

## 2014-01-11 MED ORDER — DOCUSATE SODIUM 100 MG PO CAPS: 100.0000 mg | ORAL_CAPSULE | Freq: Two times a day (BID) | ORAL | Status: DC | PRN

## 2014-01-11 NOTE — Progress Notes (Signed)
Pressure dressing over LT incision removed by RN after patient's shower. Incision approximated, no drainage noted; Placed honeycomb dressing over incision per call to MD Sharen CounterLisa Leftwich-Kirby. Incisional care instructions reviewed with patient. Patient verbalized her understanding of care. Rae HalstedAustin, Arleigh Dicola Elizabeth, RN

## 2014-01-11 NOTE — Progress Notes (Signed)
Post Partum Day 1 Subjective:  Kayla Patterson is a 33 y.o. [id number] s/p pLTCS 2/2 hx of 4th degree, declining to labor.  No acute events overnight.  Pt denies problems with ambulating, voiding or po intake.  She  denies nausea or vomiting.  Pain is moderately controlled.  She has had flatus. She has not had bowel movement.  Lochia Small.  Plan for birth control is IUD.  Method of Feeding: breast  Objective: Blood pressure 109/69, pulse 85, temperature 98.6 F (37 C), temperature source Oral, resp. rate 18, height 5\' 8"  (1.727 m), weight 83.008 kg (183 lb), last menstrual period 04/14/2013, SpO2 96.00%, unknown if currently breastfeeding.  Physical Exam:  General: alert, cooperative and no distress Lochia:normal flow Chest: CTAB Heart: RRR no m/r/g Abdomen: +BS, soft, nontender,  Uterine Fundus: firm, skin incision dressing clean (last changed prior to patient leaving PACU yesterday) DVT Evaluation: No evidence of DVT seen on physical exam. Extremities: no edema   Recent Labs  01/09/14 1040 01/11/14 0630  HGB 12.0 8.3*  HCT 35.6* 24.9*    Assessment/Plan:  ASSESSMENT: Kayla Patterson is a 33 y.o. [id number] s/p C/S without labor.  POD #1. presence of Moderate post-partum anemia, no treatment required.  Plan for discharge tomorrow   LOS: 1 day   Griffin Dewilde ROCIO 01/11/2014, 8:49 AM

## 2014-01-12 MED ORDER — AMBULATORY NON FORMULARY MEDICATION: Status: DC

## 2014-01-12 MED ORDER — IBUPROFEN 600 MG PO TABS: 600.0000 mg | ORAL_TABLET | Freq: Four times a day (QID) | ORAL | Status: DC

## 2014-01-12 MED ORDER — OXYCODONE-ACETAMINOPHEN 5-325 MG PO TABS: 1.0000 | ORAL_TABLET | ORAL | Status: DC | PRN

## 2014-01-12 NOTE — Discharge Summary (Signed)
Obstetric Discharge Summary Reason for Admission: cesarean section Prenatal Procedures: NST Intrapartum Procedures: cesarean: low cervical, transverse Postpartum Procedures: none Complications-Operative and Postpartum: none Hemoglobin  Date Value Ref Range Status  01/11/2014 8.3* 12.0 - 15.0 g/dL Final     DELTA CHECK NOTED     REPEATED TO VERIFY     HCT  Date Value Ref Range Status  01/11/2014 24.9* 36.0 - 46.0 % Final   Prescott ParmaMichelle Patterson is a 33 y.o. G3P1011 with IUP at 4051w2d presenting for presenting for scheduled cesarean section for history of traumatic fourth degree laceration sustained during last delivery. Please see previous prenatal notes for further details. No acute concerns.  Pt. Was admitted to the hospital and brought to the OR where she subsequently underwent uncomplicated Cesarean delivery by primary Low Transverse incision. She has had an uncomplicated postpartum course. She is now ambulating, tolerating po, +Flatus, pain well controlled, minimal vaginal bleeding, Incision C/D/I. She has no complaints. She is stable and ready for discharge. She is breastfeeding, and she desires an IUD for long term contraception.   Physical Exam:  General: alert and cooperative Lochia: appropriate Uterine Fundus: firm Incision: healing well, no significant drainage, no dehiscence, no significant erythema DVT Evaluation: No evidence of DVT seen on physical exam. No cords or calf tenderness.  Discharge Diagnoses: Term Pregnancy-delivered  Discharge Information: Date: 01/12/2014 Activity: unrestricted and pelvic rest Diet: routine Medications: PNV, Ibuprofen and Percocet Condition: stable Instructions: refer to practice specific booklet Discharge to: home Follow-up Information   [facility]. Schedule an appointment as soon as possible for a visit in 4 weeks. (Postpartum follow up and IUD)    Specialty:  Obstetrics and Gynecology   Contact information:   [address] [place] [id number] [phone]      Newborn Data: Live born female  Birth Weight: 9 lb 4.2 oz (4201 g) APGAR: 8, 9  Home with mother.  Patterson, Kayla G 01/12/2014, 7:46 AM  I have seen and examined this patient and I agree with the above. Cam HaiSHAW, KIMBERLY CNM 8:25 AM 01/12/2014

## 2014-01-12 NOTE — Progress Notes (Signed)
Attending Circumcision Counseling Progress Note  Patient desires circumcision for her female infant.  Circumcision procedure details discussed, risks and benefits of procedure were also discussed.  These include but are not limited to: Benefits of circumcision in men include reduction in the rates of urinary tract infection (UTI), penile cancer, some sexually transmitted infections, penile inflammatory and retractile disorders, as well as easier hygiene.  Risks include bleeding , infection, injury of glans which may lead to penile deformity or urinary tract issues, unsatisfactory cosmetic appearance and other potential complications related to the procedure.  It was emphasized that this is an elective procedure.  Patient wants to proceed with circumcision; written informed consent obtained.  Will do circumcision soon, routine circumcision and post circumcision care ordered for the infant.  Jaynie CollinsUGONNA  Eiza Canniff, M.D. 01/12/2014 8:42 AM

## 2014-01-12 NOTE — Lactation Note (Signed)
This note was copied from the chart of Kayla Prescott ParmaMichelle Alperin. Lactation Consultation Note Baby fussy cluster feeding. Mom had BF baby mostly to Lt. Breast using NS. Shortly after a feeding baby fussy wanting to eat again, mom didn't have NS on, stated nipple was everted still from feeding so she latched onto nipple. Denied pain, nipple red, bruised from previous day. Encouraged mom to use NS d/t short shaft, explained milk transfer and needing that depth to obtain the milk transfer and causing more trauma to nipples not using NS until her nipples are more erect. Mom didn't wear shells today. Encouraged her to wear them today to help evert nipples more, also it helps with soreness and prevents nipples from rubbing in bra. Mom is pre-pumping before applying NS. Mom stated that she is unable to get colostrum out and with her first child had an abundance of colostrum. SNS iniated d/t fussiness and cluster feeding. Mom had tried to supplement w/formula in a bottle but baby wouldn't take it. Had been spity today. After SNS to breast and feeding for 30 min. Baby very satisfied and sleeping soundly STS w/mom. SNS taught to mom, demonstrated cleaning.  Patient Name: Kayla Prescott ParmaMichelle Pinedo ZOXWR'UToday's Date: 01/12/2014 Reason for consult: Follow-up assessment   Maternal Data    Feeding Feeding Type: Formula Length of feed: 30 min  LATCH Score/Interventions Latch: Grasps breast easily, tongue down, lips flanged, rhythmical sucking. Intervention(s): Skin to skin Intervention(s): Adjust position;Assist with latch;Breast massage;Breast compression  Audible Swallowing: Spontaneous and intermittent Intervention(s): Skin to skin;Hand expression Intervention(s): Hand expression;Alternate breast massage;Skin to skin  Type of Nipple: Flat Intervention(s): Shells;Hand pump  Comfort (Breast/Nipple): Filling, red/small blisters or bruises, mild/mod discomfort Problem noted: Cracked, bleeding, blisters,  bruises Intervention(s): Expressed breast milk to nipple;Hand pump (no colostrum noted on hand expression)  Interventions (Mild/moderate discomfort): Comfort gels;Breast shields;Pre-pump if needed;Hand expression;Hand massage  Hold (Positioning): Assistance needed to correctly position infant at breast and maintain latch. Intervention(s): Breastfeeding basics reviewed;Support Pillows;Position options;Skin to skin  LATCH Score: 7  Lactation Tools Discussed/Used Tools: Shells;Nipple Shields;Pump;Supplemental Nutrition System;Comfort gels Nipple shield size: 20 (Rt. nipple) Shell Type: Inverted Breast pump type: Manual   Consult Status Consult Status: Follow-up Date: 01/12/14 Follow-up type: In-patient    Charyl DancerCARVER, Vernita Tague G 01/12/2014, 3:17 AM

## 2014-01-12 NOTE — Discharge Instructions (Signed)
Before Baby Comes Home °Ask any questions about feeding, diapering, and baby care before you leave the hospital. Ask again if you do not understand. Ask when you need to see the doctor again. °There are several things you must have before your baby comes home. °· Infant car seat. °· Crib. °¨ Do not let your baby sleep in a bed with you or anyone else. °¨ If you do not have a bed for your baby, ask the doctor what you can use that will be safe for the baby to sleep in. °Infant feeding supplies: °· 6 to 8 bottles (8 ounce size). °· 6 to 8 nipples. °· Measuring cup. °· Measuring tablespoon. °· Bottle brush. °· Sterilizer (or use any large pan or kettle with a lid). °· Formula that contains iron. °· A way to boil and cool water. °Breastfeeding supplies: °· Breast pump. °· Nipple cream. °Clothing: °· 24 to 36 cloth diapers and waterproof diaper covers or a box of disposable diapers. You may need as many as 10 to 12 diapers per day. °· 3 onesies (other clothing will depend on the time of year and the weather). °· 3 receiving blankets. °· 3 baby pajamas or gowns. °· 3 bibs. °Bath equipment: °· Mild soap. °· Petroleum jelly. No baby oil or powder. °· Soft cloth towel and washcloth. °· Cotton balls. °· Separate bath basin for baby. Only sponge bathe until umbilical cord and circumcision are healed. °Other supplies: °· Thermometer and bulb syringe (ask the hospital to send them home with you). Ask your doctor about how you should take your baby's temperature. °· One to two pacifiers. °Prepare for an emergency: °· Know how to get to the hospital and know where to admit your baby. °· Put all doctor numbers near your house phone and in your cell phone if you have one. °Prepare your family: °· Talk with siblings about the baby coming home and how they feel about it. °· Decide how you want to handle visitors and other family members. °· Take offers for help with the baby. You will need time to adjust. °Know when to call the  doctor.  °GET HELP RIGHT AWAY IF: °· Your baby's temperature is greater than 100.4°F (38°C). °· The soft spot on your baby's head starts to bulge. °· Your baby is crying with no tears or has no wet diapers for 6 hours. °· Your baby has rapid breathing. °· Your baby is not as alert. °Document Released: 05/01/2008 Document Revised: 10/03/2013 Document Reviewed: 08/08/2010 °ExitCare® Patient Information ©2015 [employer]. This information is not intended to replace advice given to you by your health care provider. Make sure you discuss any questions you have with your health care provider. °Cesarean Delivery, Care After °Refer to this sheet in the next few weeks. These instructions provide you with information on caring for yourself after your procedure. Your health care provider may also give you specific instructions. Your treatment has been planned according to current medical practices, but problems sometimes occur. Call your health care provider if you have any problems or questions after you go home. °HOME CARE INSTRUCTIONS  °· Only take over-the-counter or prescription medications as directed by your health care provider. °· Do not drink alcohol, especially if you are breastfeeding or taking medication to relieve pain. °· Do not chew or smoke tobacco. °· Continue to use good perineal care. Good perineal care includes: °¨ Wiping your perineum from front to back. °¨ Keeping your perineum clean. °· Check your   surgical cut (incision) daily for increased redness, drainage, swelling, or separation of skin. °· Clean your incision gently with soap and water every day, and then pat it dry. If your health care provider says it is okay, leave the incision uncovered. Use a bandage (dressing) if the incision is draining fluid or appears irritated. If the adhesive strips across the incision do not fall off within 7 days, carefully peel them off. °· Hug a pillow when coughing or sneezing until your incision is healed. This  helps to relieve pain. °· Do not use tampons or douche until your health care provider says it is okay. °· Shower, wash your hair, and take tub baths as directed by your health care provider. °· Wear a well-fitting bra that provides breast support. °· Limit wearing support panties or control-top hose. °· Drink enough fluids to keep your urine clear or pale yellow. °· Eat high-fiber foods such as whole grain cereals and breads, brown rice, beans, and fresh fruits and vegetables every day. These foods may help prevent or relieve constipation. °· Resume activities such as climbing stairs, driving, lifting, exercising, or traveling as directed by your health care provider. °· Talk to your health care provider about resuming sexual activities. This is dependent upon your risk of infection, your rate of healing, and your comfort and desire to resume sexual activity. °· Try to have someone help you with your household activities and your newborn for at least a few days after you leave the hospital. °· Rest as much as possible. Try to rest or take a nap when your newborn is sleeping. °· Increase your activities gradually. °· Keep all of your scheduled postpartum appointments. It is very important to keep your scheduled follow-up appointments. At these appointments, your health care provider will be checking to make sure that you are healing physically and emotionally. °SEEK MEDICAL CARE IF:  °· You are passing large clots from your vagina. Save any clots to show your health care provider. °· You have a foul smelling discharge from your vagina. °· You have trouble urinating. °· You are urinating frequently. °· You have pain when you urinate. °· You have a change in your bowel movements. °· You have increasing redness, pain, or swelling near your incision. °· You have pus draining from your incision. °· Your incision is separating. °· You have painful, hard, or reddened breasts. °· You have a severe headache. °· You have  blurred vision or see spots. °· You feel sad or depressed. °· You have thoughts of hurting yourself or your newborn. °· You have questions about your care, the care of your newborn, or medications. °· You are dizzy or light-headed. °· You have a rash. °· You have pain, redness, or swelling at the site of the removed intravenous access (IV) tube. °· You have nausea or vomiting. °· You stopped breastfeeding and have not had a menstrual period within 12 weeks of stopping. °· You are not breastfeeding and have not had a menstrual period within 12 weeks of delivery. °· You have a fever. °SEEK IMMEDIATE MEDICAL CARE IF: °· You have persistent pain. °· You have chest pain. °· You have shortness of breath. °· You faint. °· You have leg pain. °· You have stomach pain. °· Your vaginal bleeding saturates 2 or more sanitary pads in 1 hour. °MAKE SURE YOU:  °· Understand these instructions. °· Will watch your condition. °· Will get help right away if you are not doing well or   get worse. °Document Released: 02/08/2002 Document Revised: 10/03/2013 Document Reviewed: 01/14/2012 °ExitCare® Patient Information ©2015 [employer]. This information is not intended to replace advice given to you by your health care provider. Make sure you discuss any questions you have with your health care provider. ° °

## 2014-01-12 NOTE — Discharge Summary (Signed)
Attestation of Attending Supervision of Advanced Practitioner (PA/CNM/NP): Evaluation and management procedures were performed by the Advanced Practitioner under my supervision and collaboration.  I have reviewed the Advanced Practitioner's note and chart, and I agree with the management and plan.  Julicia Krieger, MD, FACOG Attending Obstetrician & Gynecologist Faculty Practice, [facility]   

## 2014-01-17 ENCOUNTER — Ambulatory Visit (HOSPITAL_COMMUNITY)
Admission: RE | Admit: 2014-01-17 | Discharge: 2014-01-17 | Disposition: A | Discharge status: Home / Self Care | Payer: BC Managed Care – PPO | Source: Ambulatory Visit | Attending: Obstetrics & Gynecology | Admitting: Obstetrics & Gynecology

## 2014-01-17 NOTE — Lactation Note (Signed)
Lactation Consult  Mother's reason for visit:  SN Using NS Visit Type:  Feeding assist Appointment Notes:  Mom reports very sore nipples- raw on both tips Consult:  Initial Lactation Consultant:  Kayla Patterson, Kayla Patterson  ________________________________________________________________________    ________________________________________________________________________  Mother's Name: Kayla Patterson    Breastfeeding Experience:  P2 Diff latch with first- pumped for 5 months    ________________________________________________________________________  Breastfeeding History (Post Discharge)  Frequency of breastfeeding:  Q 2-3 hours Duration of feeding:  30-45  Supplementation  Formula:  Volume 30-8160ml Frequency:  1-2 times/day Total volume per day: 60-120 ml       Brand: Enfamil  Breastmilk:  Volume 2-3 oz Frequency:  3-5/day   Method:  Bottle,   Pumping  Type of pump:  Medela pump in style Frequency:  4-5 times/day Volume:  3-4 oz  Infant Intake and Output Assessment  Voids:  8-10  in 24 hrs.  Color:  Clear yellow Stools:   8-12 in 24 hrs.  Color:  Yellow  ________________________________________________________________________  Maternal Breast Assessment  Breast:  Filling Nipple:  Flat Pain level:  6 Pain interventions:  Comfort gels, All purpose nipple cream and Nipple shield  _______________________________________________________________________ Feeding Assessment/Evaluation  Initial feeding assessment:  Infant's oral assessment:  WNL  Positioning:  Football Left breast  LATCH documentation:  Latch:  2 = Grasps breast easily, tongue down, lips flanged, rhythmical sucking.  Audible swallowing:  2 = Spontaneous and intermittent  Type of nipple:  1 = Flat  Comfort (Breast/Nipple):  1 = Filling, red/small blisters or bruises, mild/mod discomfort  Hold (Positioning):  1 = Assistance needed to correctly position infant at breast and maintain latch  LATCH  score:  7  Attached assessment:  Deep  Lips flanged:  Yes.    Lips untucked:  No.  Suck assessment:  Nutritive  Tools:  Nipple shield 20 mm Instructed on use and cleaning of tool:  Yes.    Pre-feed weight:  4136 g  (9 lb. 1.9 oz.) Post-feed weight:  4160 g (9 lb. 2.7 oz.) Amount transferred:  24 ml   Additional Feeding Assessment -   Infant's oral assessment:  WNL  Positioning:  Football Right breast  LATCH documentation:  Latch:  2 = Grasps breast easily, tongue down, lips flanged, rhythmical sucking.  Audible swallowing:  2 = Spontaneous and intermittent  Type of nipple:  1 = Flat  Comfort (Breast/Nipple):  1 = Filling, red/small blisters or bruises, mild/mod discomfort  Hold (Positioning):  1 = Assistance needed to correctly position infant at breast and maintain latch  LATCH score:  7  Attached assessment:  Deep  Lips flanged:  Yes.    Lips untucked:  No.  Suck assessment:  Nutritive  Tools:  Nipple shield 24 mm Instructed on use and cleaning of tool:  Yes.    Did not reweigh because he was dresses and then ready to nurse more  Total amount pumped post feed: Mom did not bring pump with her  Total amount transferred: 24+ ml  Mom needs assist with latch- Both nipples raw on tips and mom reports they have been bleeding Has Kayla Patterson nipple cream and using that. New set of comfort gels given with instructions. Continue using NS. Encouraged mom to nap  when baby naps. She is very tired. Wants to make another appointment for assist- made for next Friday 8/28 at 9 am. No questions at present. To call prn

## 2014-01-27 ENCOUNTER — Ambulatory Visit (HOSPITAL_COMMUNITY): Payer: BC Managed Care – PPO

## 2014-02-10 ENCOUNTER — Ambulatory Visit (INDEPENDENT_AMBULATORY_CARE_PROVIDER_SITE_OTHER): Payer: BC Managed Care – PPO | Admitting: Obstetrics & Gynecology

## 2014-02-10 ENCOUNTER — Encounter: Payer: Self-pay | Admitting: Obstetrics & Gynecology

## 2014-02-10 DIAGNOSIS — Z98891 History of uterine scar from previous surgery: Secondary | ICD-10-CM

## 2014-02-10 DIAGNOSIS — Z3009 Encounter for other general counseling and advice on contraception: Secondary | ICD-10-CM

## 2014-02-10 DIAGNOSIS — Z30011 Encounter for initial prescription of contraceptive pills: Secondary | ICD-10-CM

## 2014-02-10 MED ORDER — NORETHINDRONE 0.35 MG PO TABS: 1.0000 | ORAL_TABLET | Freq: Every day | ORAL | Status: DC

## 2014-02-10 NOTE — Progress Notes (Signed)
   Subjective:     Kayla Patterson is a 33 y.o. G72P2012 female who presents for a postpartum visit. She is s/p PLTCS due for history of traumatic fourth degree laceration sustained during last delivery. I have fully reviewed the prenatal and intrapartum course. The delivery was at 39 gestational weeks.  Postpartum course has been uncomplicated. Baby's course has been uncomplicated. Baby is feeding by breast. Bleeding: thin lochia. Bowel function is normal. Bladder function is normal. Patient is not sexually active. Contraception method is oral progesterone-only contraceptive. Postpartum depression screening: negative.  The following portions of the patient's history were reviewed and updated as appropriate: allergies, current medications, past family history, past medical history, past social history, past surgical history and problem list.  Normal pap in 07/2013 by previous OB provider.  Review of Systems Pertinent items are noted in HPI.   Objective:    BP 114/83  Pulse 107  Ht  (1.727 m)  Wt 160 lb (72.576 kg)  BMI 24.33 kg/m2  Breastfeeding? Yes  General:  alert and no distress   Breasts:  inspection negative, no nipple discharge or bleeding, no masses or nodularity palpable  Abdomen: soft, non-tender; bowel sounds normal; no masses,  no organomegaly and incision is healing well, C/D/I, no erythema or induration   Pelvic:  not evaluated        Assessment:   Normal postpartum exam. Pap smear not done at today's visit.   Plan:   1. Contraception: oral progesterone-only contraceptive prescribed 2. Follow up as needed.  3. Routine preventative health maintenance measures emphasized.   Jaynie Collins, MD, FACOG Attending Obstetrician & Gynecologist Center for [employer], [facility]

## 2014-02-10 NOTE — Patient Instructions (Signed)
Place postpartum visit patient instructions here.   Postpartum Depression and Baby Blues The postpartum period begins right after the birth of a baby. During this time, there is often a great amount of joy and excitement. It is also a time of many changes in the life of the parents. Regardless of how many times a mother gives birth, each child brings new challenges and dynamics to the family. It is not unusual to have feelings of excitement along with confusing shifts in moods, emotions, and thoughts. All mothers are at risk of developing postpartum depression or the "baby blues." These mood changes can occur right after giving birth, or they may occur many months after giving birth. The baby blues or postpartum depression can be mild or severe. Additionally, postpartum depression can go away rather quickly, or it can be a long-term condition.  CAUSES Raised hormone levels and the rapid drop in those levels are thought to be a main cause of postpartum depression and the baby blues. A number of hormones change during and after pregnancy. Estrogen and progesterone usually decrease right after the delivery of your baby. The levels of thyroid hormone and various cortisol steroids also rapidly drop. Other factors that play a role in these mood changes include major life events and genetics.  RISK FACTORS If you have any of the following risks for the baby blues or postpartum depression, know what symptoms to watch out for during the postpartum period. Risk factors that may increase the likelihood of getting the baby blues or postpartum depression include:  Having a personal or family history of depression.   Having depression while being pregnant.   Having premenstrual mood issues or mood issues related to oral contraceptives.  Having a lot of life stress.   Having marital conflict.   Lacking a social support network.   Having a baby with special needs.   Having health problems, such as  diabetes.  SIGNS AND SYMPTOMS Symptoms of baby blues include:  Brief changes in mood, such as going from extreme happiness to sadness.  Decreased concentration.   Difficulty sleeping.   Crying spells, tearfulness.   Irritability.   Anxiety.  Symptoms of postpartum depression typically begin within the first month after giving birth. These symptoms include:  Difficulty sleeping or excessive sleepiness.   Marked weight loss.   Agitation.   Feelings of worthlessness.   Lack of interest in activity or food.  Postpartum psychosis is a very serious condition and can be dangerous. Fortunately, it is rare. Displaying any of the following symptoms is cause for immediate medical attention. Symptoms of postpartum psychosis include:   Hallucinations and delusions.   Bizarre or disorganized behavior.   Confusion or disorientation.  DIAGNOSIS  A diagnosis is made by an evaluation of your symptoms. There are no medical or lab tests that lead to a diagnosis, but there are various questionnaires that a health care provider may use to identify those with the baby blues, postpartum depression, or psychosis. Often, a screening tool called the Edinburgh Postnatal Depression Scale is used to diagnose depression in the postpartum period.  TREATMENT The baby blues usually goes away on its own in 1-2 weeks. Social support is often all that is needed. You will be encouraged to get adequate sleep and rest. Occasionally, you may be given medicines to help you sleep.  Postpartum depression requires treatment because it can last several months or longer if it is not treated. Treatment may include individual or group therapy, medicine, or   to address any social, physiological, and psychological factors that may play a role in the depression. Regular exercise, a healthy diet, rest, and social support may also be strongly recommended.  Postpartum psychosis is more serious and needs treatment  right away. Hospitalization is often needed. HOME CARE INSTRUCTIONS  Get as much rest as you can. Nap when the baby sleeps.   Exercise regularly. Some women find yoga and walking to be beneficial.   Eat a balanced and nourishing diet.   Do little things that you enjoy. Have a cup of tea, take a bubble bath, read your favorite magazine, or listen to your favorite music.  Avoid alcohol.   Ask for help with household chores, cooking, grocery shopping, or running errands as needed. Do not try to do everything.   Talk to people close to you about how you are feeling. Get support from your partner, family members, friends, or other new moms.  Try to stay positive in how you think. Think about the things you are grateful for.   Do not spend a lot of time alone.   Only take over-the-counter or prescription medicine as directed by your health care provider.  Keep all your postpartum appointments.   Let your health care provider know if you have any concerns.  SEEK MEDICAL CARE IF: You are having a reaction to or problems with your medicine. SEEK IMMEDIATE MEDICAL CARE IF:  You have suicidal feelings.   You think you may harm the baby or someone else. MAKE SURE YOU:  Understand these instructions.  Will watch your condition.  Will get help right away if you are not doing well or get worse. Document Released: 02/21/2004 Document Revised: 05/24/2013 Document Reviewed: 02/28/2013 ExitCare Patient Information 2015 [employer]. This information is not intended to replace advice given to you by your health care provider. Make sure you discuss any questions you have with your health care provider.  

## 2014-02-22 ENCOUNTER — Ambulatory Visit: Payer: BC Managed Care – PPO | Admitting: Obstetrics & Gynecology

## 2014-02-23 ENCOUNTER — Ambulatory Visit (HOSPITAL_COMMUNITY)
Admission: RE | Admit: 2014-02-23 | Discharge: 2014-02-23 | Disposition: A | Discharge status: Home / Self Care | Payer: BC Managed Care – PPO | Source: Ambulatory Visit | Attending: Obstetrics & Gynecology | Admitting: Obstetrics & Gynecology

## 2014-02-23 NOTE — Lactation Note (Signed)
Lactation Consult  Mother's reason for visit:  Assist with latch Visit Type:  Feeding assessment Appointment Notes:  Mom reports that baby has been using the NS- wants to try to nurse without it. Reports that baby has been very fussy for the last 2 Kayla Patterson and is nursing for 18 hours of the day. Consult:  Follow-Up Lactation Consultant:  Audry Riles D  ________________________________________________________________________    ________________________________________________________________________  Mother's Name: Kayla Patterson Type of delivery:   Breastfeeding Experience:  None    ________________________________________________________________________  Breastfeeding History (Post Discharge)  Frequency of breastfeeding:  !8 hours of the day Duration of feeding:  constantly  Supplementation           Breastmilk:  Volume 2-4 oz Frequency:  3-4 times/day   Method:  Bottle,   Pumping  Type of pump:  Medela pump in style Frequency:  Occassionally Volume:  2-4 oz  Infant Intake and Output Assessment  Voids:  QS in 24 hrs.   Stools:  QS in 24 hrs.  Color:    ________________________________________________________________________  Maternal Breast Assessment  Breast:  Soft Nipple:  Erect Pain level:  1 Pain interventions:  Expressed breast milk and deeper latch  _______________________________________________________________________ Feeding Assessment/Evaluation  Initial feeding assessment:  Infant's oral assessment:  WNL  Positioning:  Cradle Left breast  LATCH documentation:  Latch:  2 = Grasps breast easily, tongue down, lips flanged, rhythmical sucking.  Audible swallowing:  2 = Spontaneous and intermittent  Type of nipple:  2 = Everted at rest and after stimulation  Comfort (Breast/Nipple):  2 = Soft / non-tender  Hold (Positioning):  1 = Assistance needed to correctly position infant at breast and maintain latch  LATCH score:  9  Baby was very  fussy- used NS and baby latched well and nursed for about 10 min. Calmer, tried without NS and Braxton latched well and nursed for 15 minutes.  Attached assessment:  Deep  Lips flanged:  Yes.    Lips untucked:  No.  Suck assessment:  Nutritive   Pre-feed weight:  5264 g  (11 lb. 9.7 oz.) Post-feed weight:  5336 g (11 lb. 12.3 oz.) Amount transferred:  72 ml Amount supplemented:  0 ml  Additional Feeding Assessment -   Infant's oral assessment:  WNL  Positioning:  Cradle Right breast  LATCH documentation:  Latch:  2 = Grasps breast easily, tongue down, lips flanged, rhythmical sucking.  Audible swallowing:  2 = Spontaneous and intermittent  Type of nipple:  2 = Everted at rest and after stimulation  Comfort (Breast/Nipple):  2 = Soft / non-tender  Hold (Positioning):  1 = Assistance needed to correctly position infant at breast and maintain latch  LATCH score:  9  Attached assessment:  Deep  Lips flanged:  Yes.    Lips untucked:  No.  Suck assessment:  Nutritive   Pre-feed weight:  5336 g  (11 lb. 12.3 oz.) Post-feed weight:  5996 g 11* lb. 14.4 oz.) Amount transferred:  60 ml Amount supplemented:   ml     Total amount transferred:  132 ml Total supplement given:  0 ml  Mom reports that this is the most calm Kayla Patterson has been after nursing. Spit up a small amount. Mom relieved that he did this well. No questions at present. To call prn

## 2014-04-03 ENCOUNTER — Encounter: Payer: Self-pay | Admitting: Obstetrics & Gynecology

## 2016-03-11 IMAGING — US US OB COMP +14 WK
1 of 2 series · 12 of 28 positions shown · non-contrast
Comparison: none

OBSTETRICS REPORT
                      (Signed Final 11/10/2013 [DATE])

Service(s) Provided
 US OB COMP + 14 WK                                    76805.1
Indications
 Basic anatomic survey
Fetal Evaluation
 Num Of Fetuses:    1
 Fetal Heart Rate:  147                          bpm
 Cardiac Activity:  Observed
 Presentation:      Cephalic
 Placenta:          Anterior, above cervical os
 P. Cord            Visualized, central
 Insertion:
 Amniotic Fluid
 AFI FV:      Subjectively within normal limits
 AFI Sum:     19.35   cm       74  %Tile     Larg Pckt:    8.23  cm
 RUQ:   3.99    cm   RLQ:    3.59   cm    LUQ:   8.23    cm   LLQ:    3.54   cm
Biometry
 BPD:     80.8  mm     G. Age:  32w 3d                CI:        78.92   70 - 86
                                                      FL/HC:      21.2   19.3 -
 HC:     287.6  mm     G. Age:  31w 4d       44  %    HC/AC:      0.94   0.96 -
 AC:     305.5  mm     G. Age:  34w 4d     > 97  %    FL/BPD:     75.4   71 - 87
 FL:      60.9  mm     G. Age:  31w 5d       66  %    FL/AC:      19.9   20 - 24
 HUM:     53.9  mm     G. Age:  31w 2d       68  %
 CER:     36.7  mm     G. Age:  31w 5d       63  %
 Est. FW:    6587  gm    4 lb 12 oz      88  %
Gestational Age
 LMP:           30w 0d        Date:  04/14/13                 EDD:   01/19/14
 U/S Today:     32w 4d                                        EDD:   01/01/14
 Best:          30w 4d     Det. By:  Early Ultrasound         EDD:   01/15/14
                                     (07/11/13)
Anatomy
 Cranium:          Appears normal         Aortic Arch:      Appears normal
 Fetal Cavum:      Appears normal         Ductal Arch:      Not well visualized
 Ventricles:       Appears normal         Diaphragm:        Appears normal
 Choroid Plexus:   Appears normal         Stomach:          Appears normal, left
                                                            sided
 Cerebellum:       Appears normal         Abdomen:          Appears normal
 Posterior Fossa:  Appears normal         Abdominal Wall:   Appears nml (cord
                                                            insert, abd wall)
 Nuchal Fold:      Not applicable (>20    Cord Vessels:     Appears normal (3
                   wks GA)                                  vessel cord)
 Face:             Appears normal         Kidneys:          Appear normal
                   (orbits and profile)
 Lips:             Appears normal         Bladder:          Appears normal
 Heart:            Appears normal         Spine:            Ltd views no
                   (4CH, axis, and                          intracranial signs of
                   situs)                                   NTD
 RVOT:             Not well visualized    Lower             Visualized
                                          Extremities:
 LVOT:             Appears normal         Upper             Visualized
 Other:  Fetus appears to be a male. Nasal bone visualized. Technically
         difficult due to advanced GA and fetal position.
Targeted Anatomy
 Fetal Central Nervous System
 Cisterna Magna:
Cervix Uterus Adnexa
 Cervix:       Not visualized (advanced GA >59wks)
 Adnexa:     No abnormality visualized.
Impression
INDICATION: 33 yr old [id number] at 43w9d for fetal anatomic
 survey. Remote read.

[Series 1: us ob comp +14 wk · 69 acquisitions, 12 frames shown]
[im 3/69]
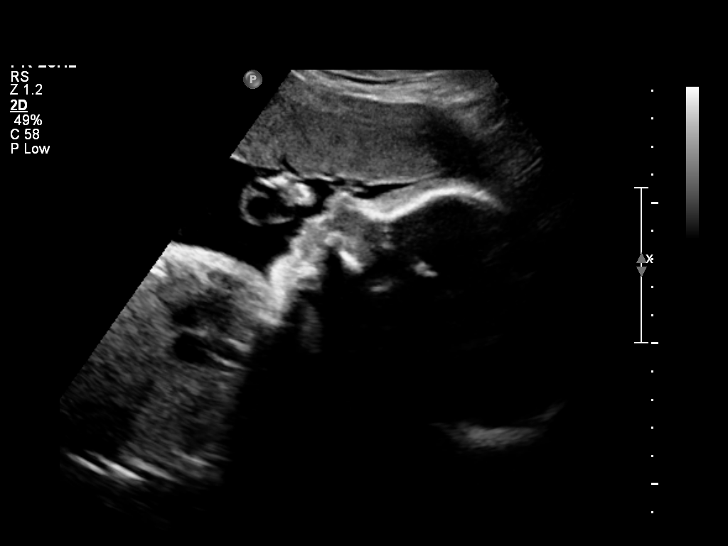
[im 8/69]
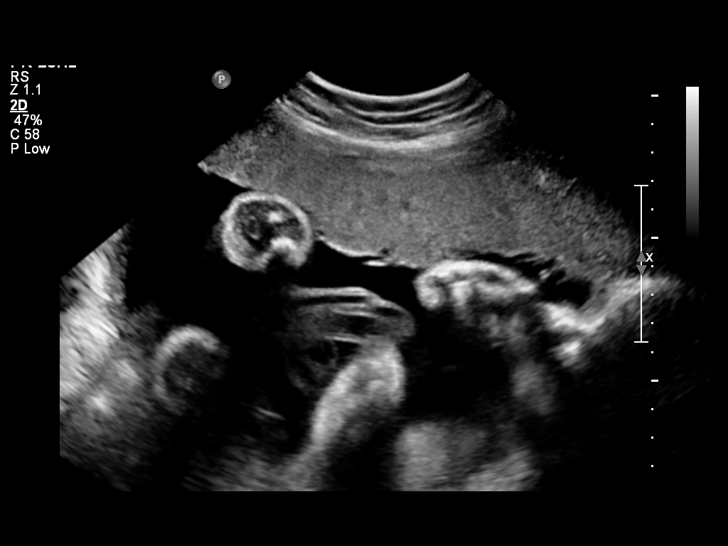
[im 14/69]
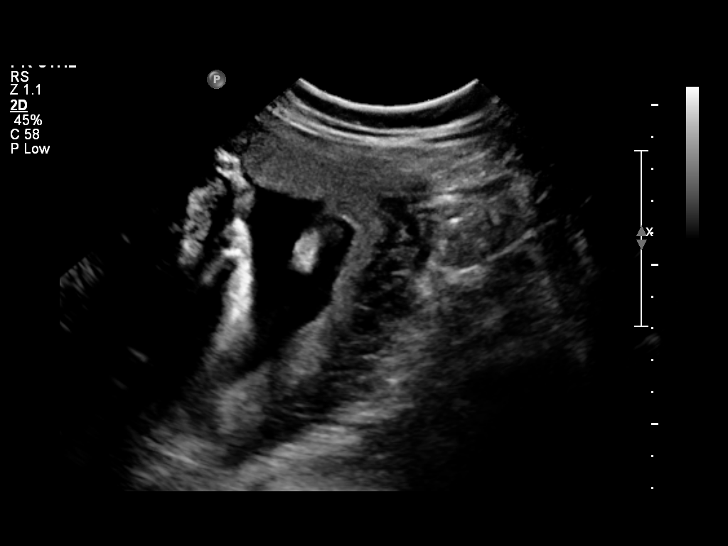
[im 21/69]
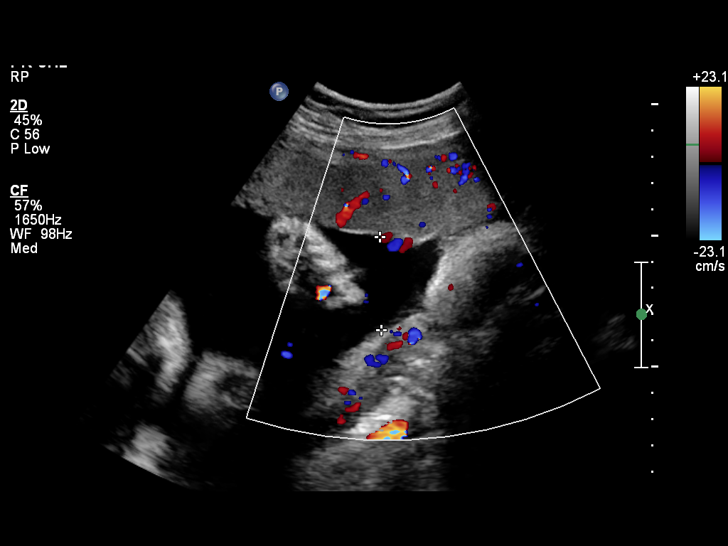
[im 27/69]
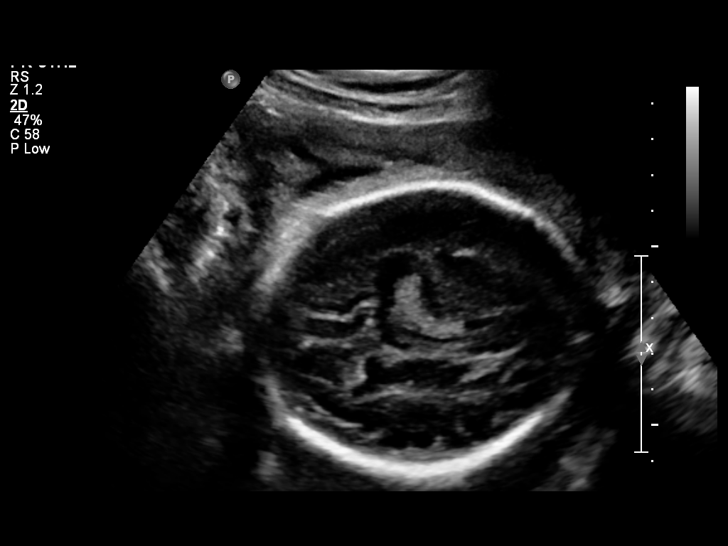
[im 32/69]
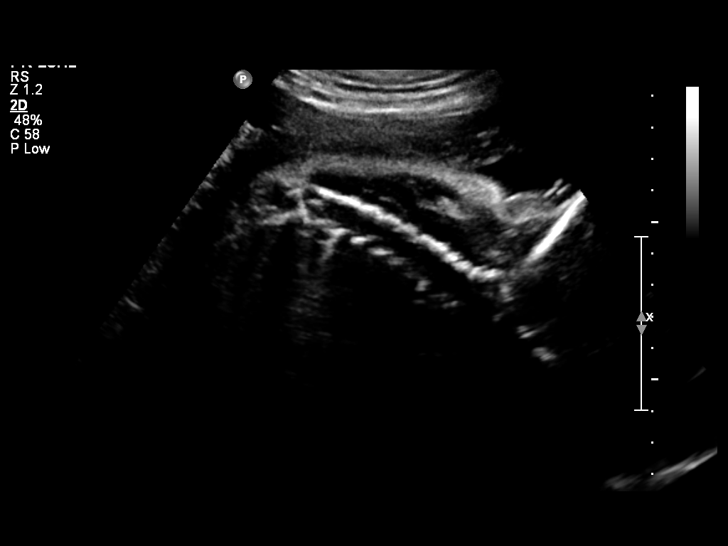
[im 40/69]
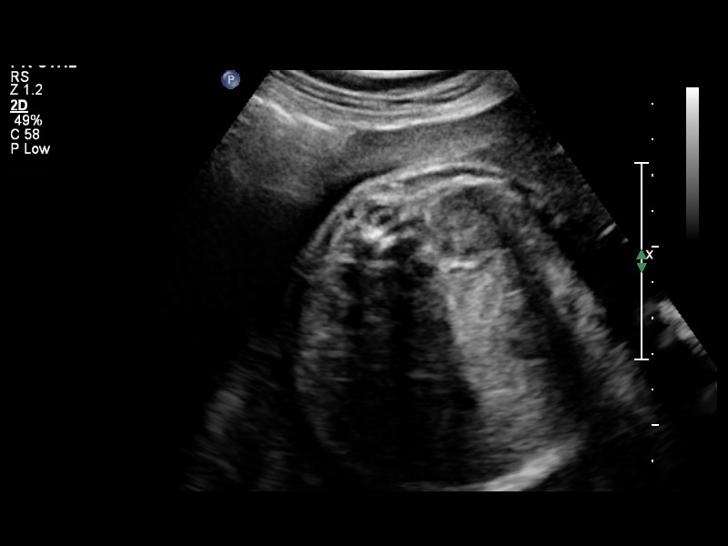
[im 45/69]
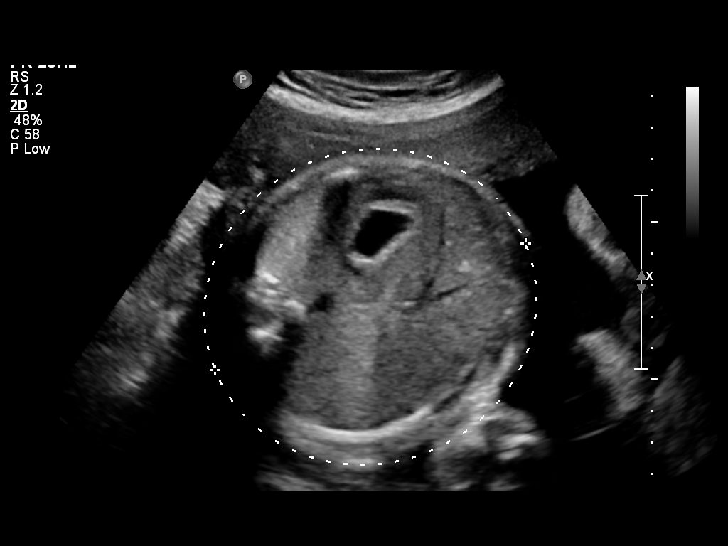
[im 50/69]
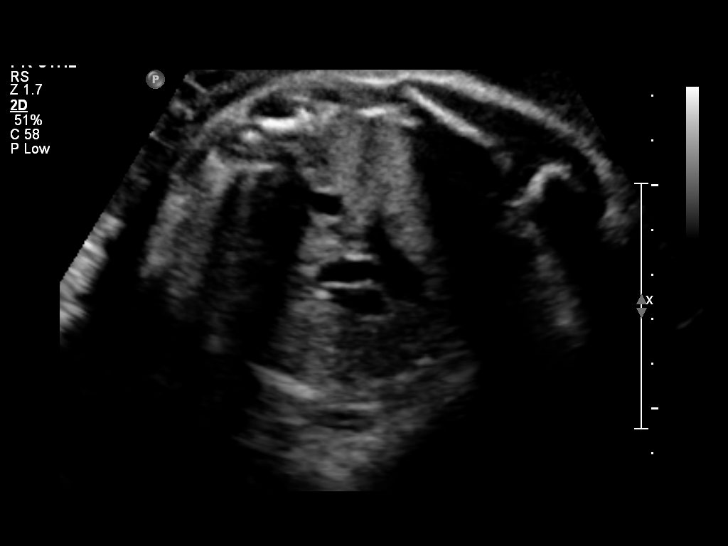
[im 58/69]
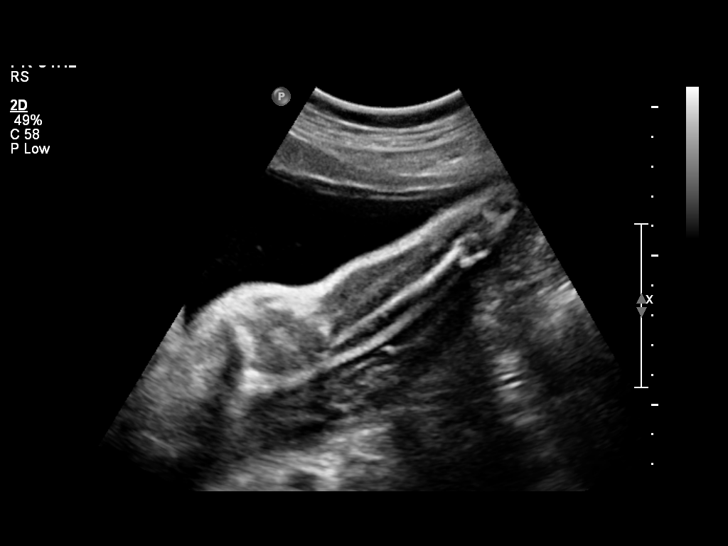
[im 63/69]
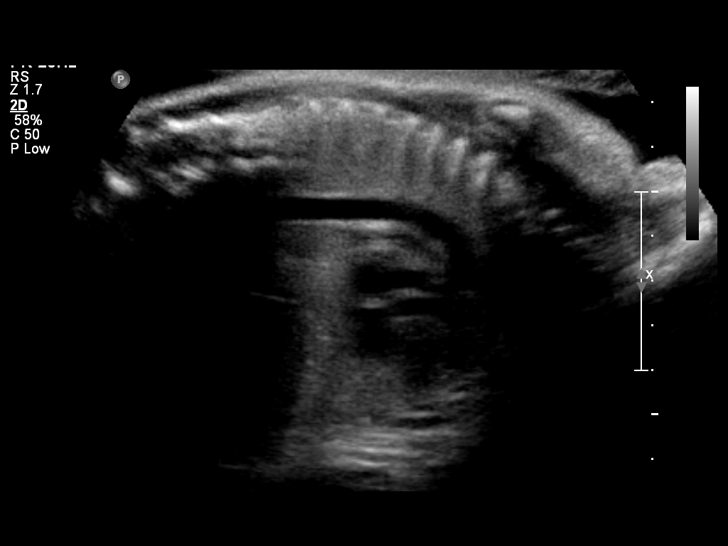
[im 69/69]
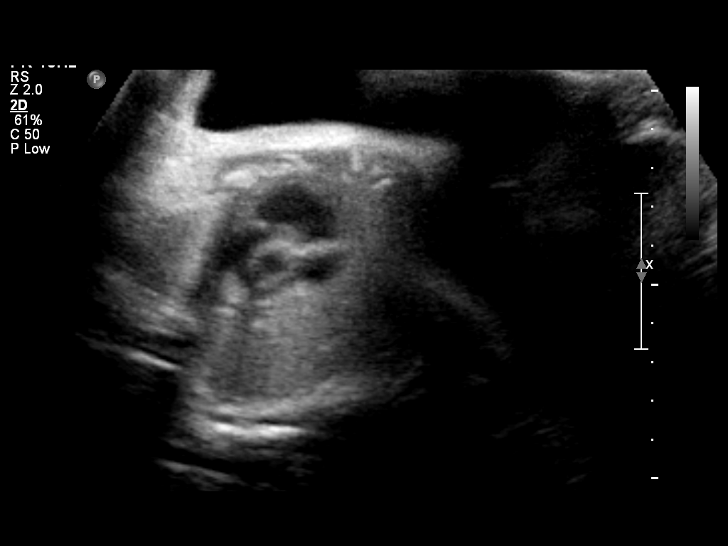

[12 of 28 positions shown; findings below may reference images not displayed]

FINDINGS: 1 Single intrauterine pregnancy.
 2. Estimated fetal weight is in the 88th%.
 3.Anterior placenta without evidence of previa.
 4. Normal amniotic fluid index.
 5. The views of the heart and spine are limited.
 6. The remainder of the limited anatomy survey is normal.
Recommendations

 1. Appropriate fetal growth; although accelerated.
 2. Limited anatomy survey:
 - recommend follow up in 3 weeks to complete anatomic
 survey

## 2016-04-08 IMAGING — US US OB FOLLOW-UP
1 series · 12 of 28 positions shown · non-contrast
Comparison: none

[Series 1: us ob follow-up · 0.23mm/px · 52 acquisitions, 12 frames shown]
[im 2/52]
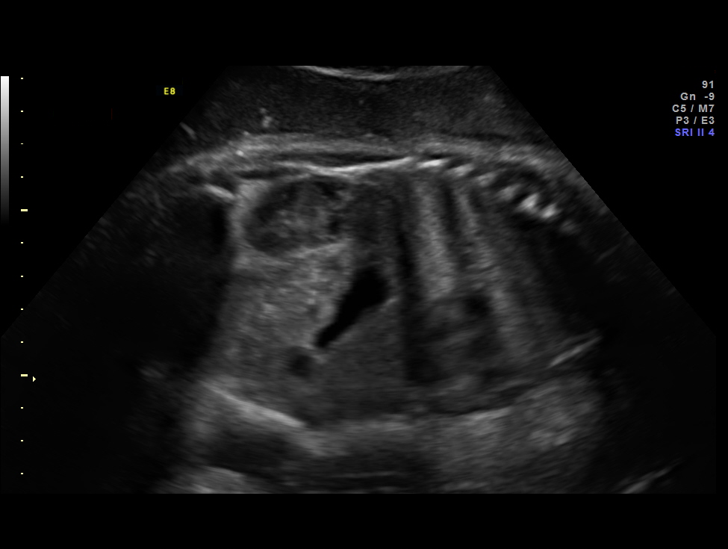
[im 6/52]
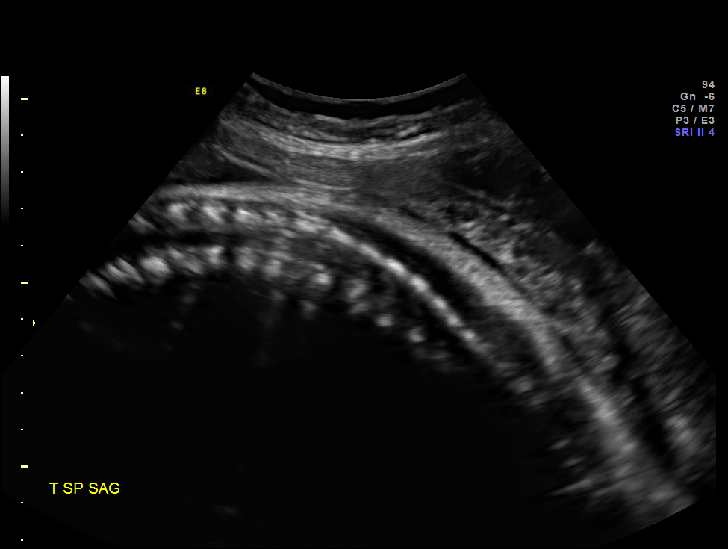
[im 10/52]
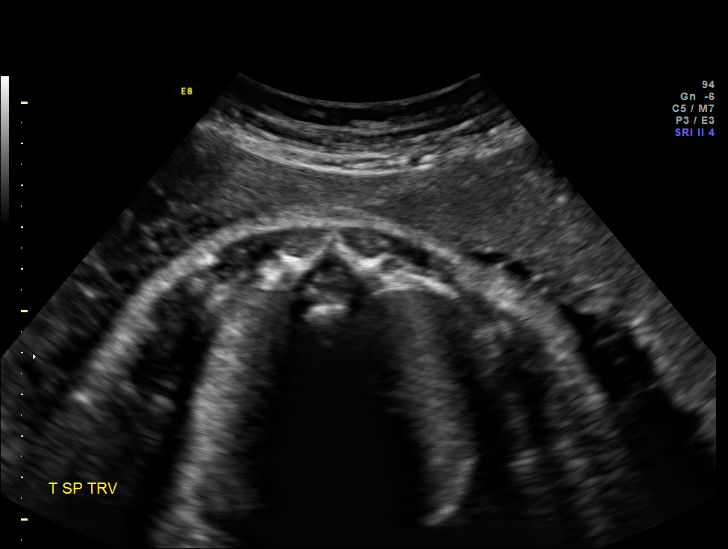
[im 16/52]
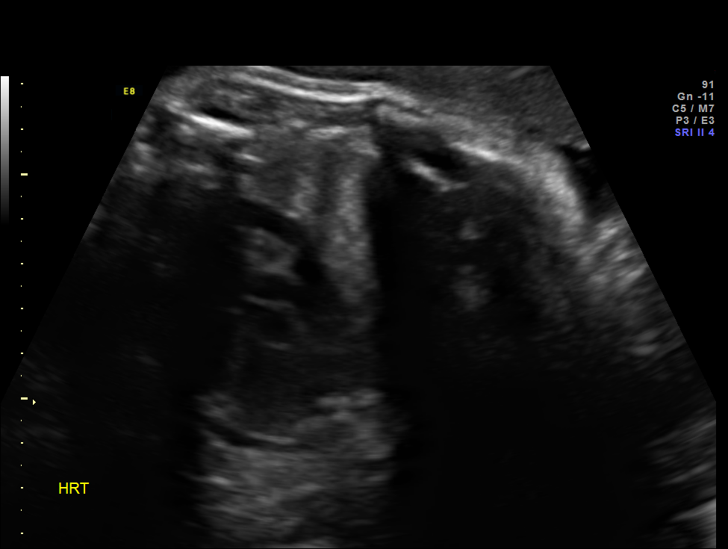
[im 19/52]
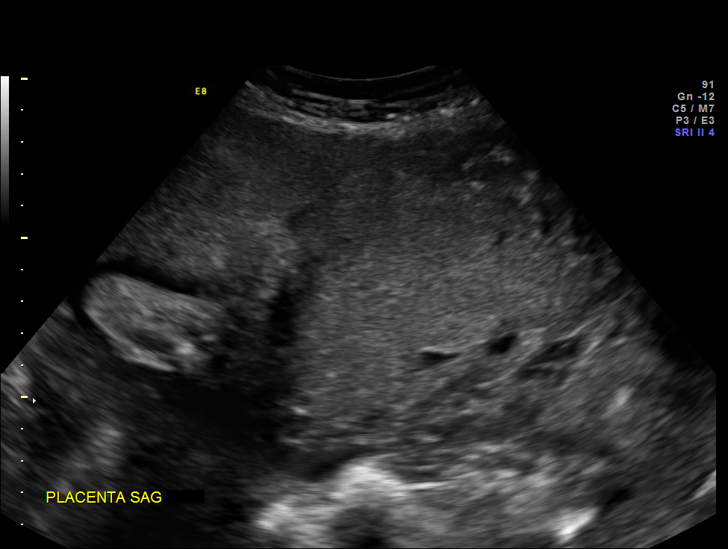
[im 23/52]
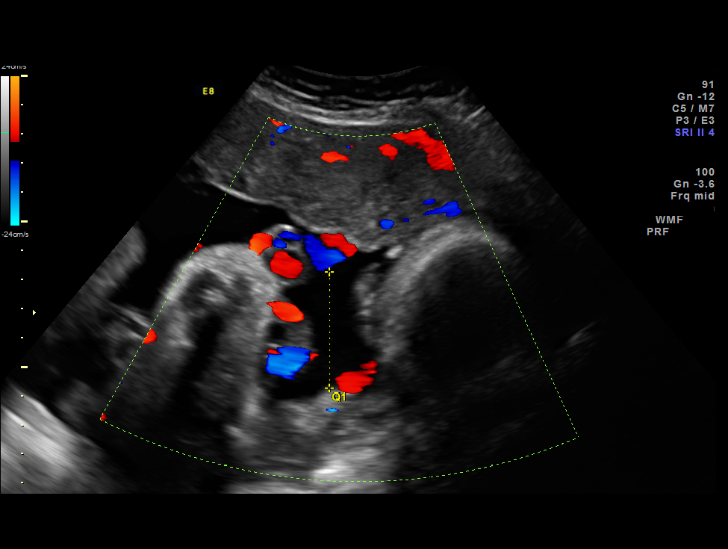
[im 29/52]
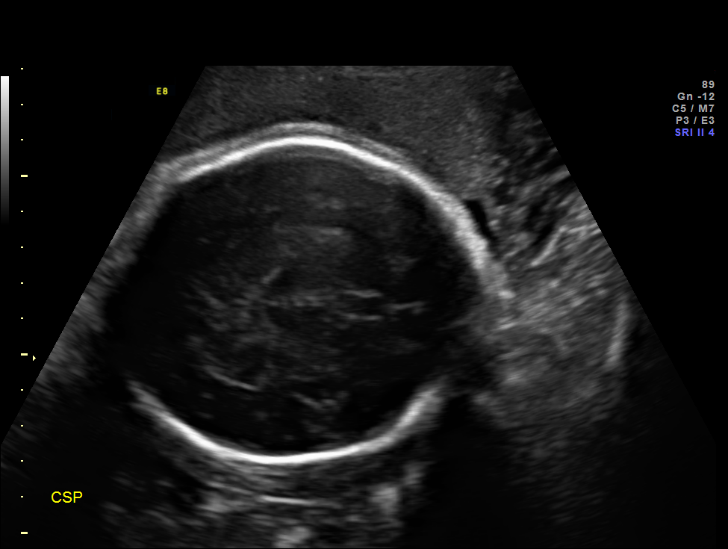
[im 33/52]
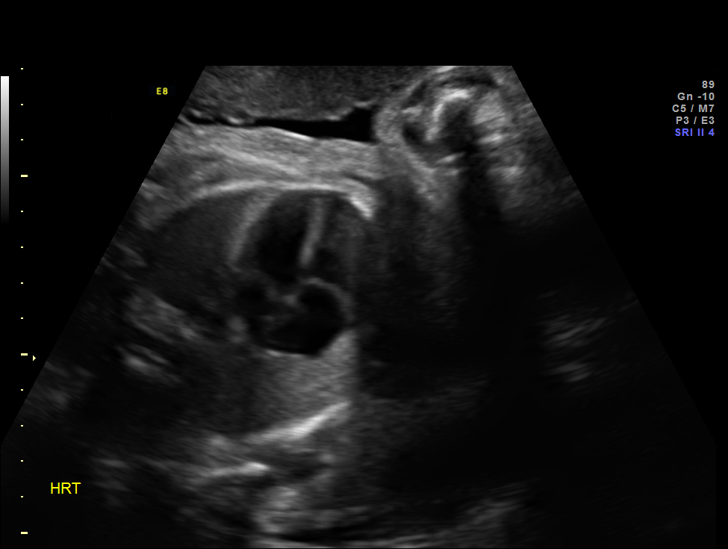
[im 36/52]
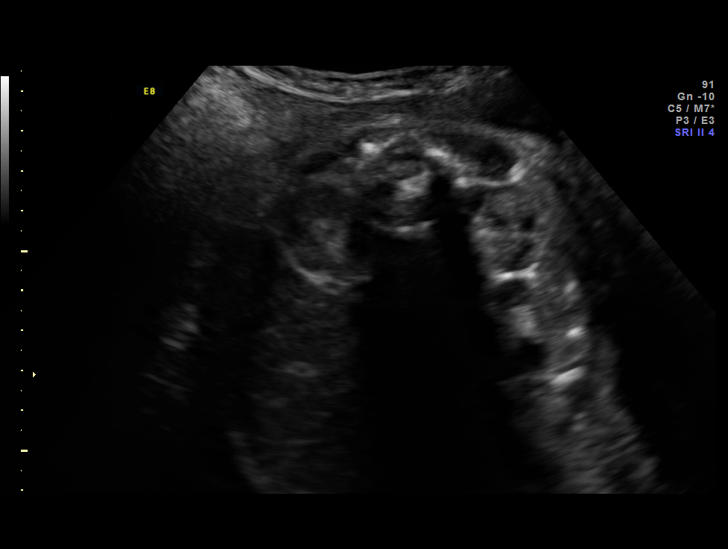
[im 42/52]
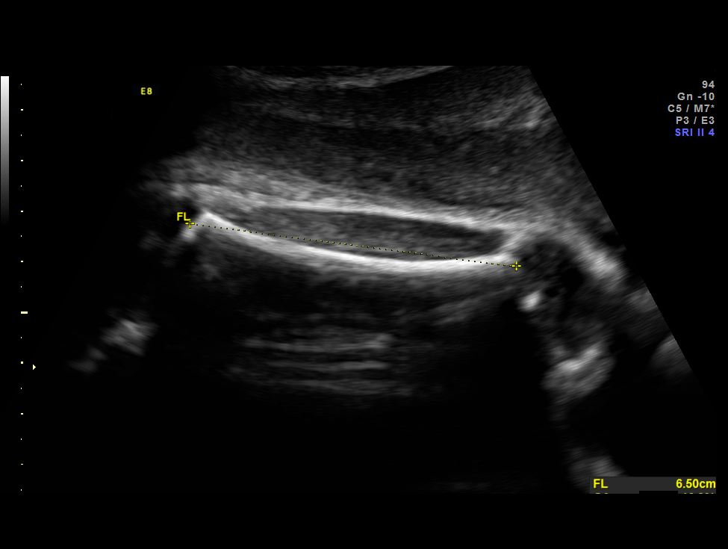
[im 46/52]
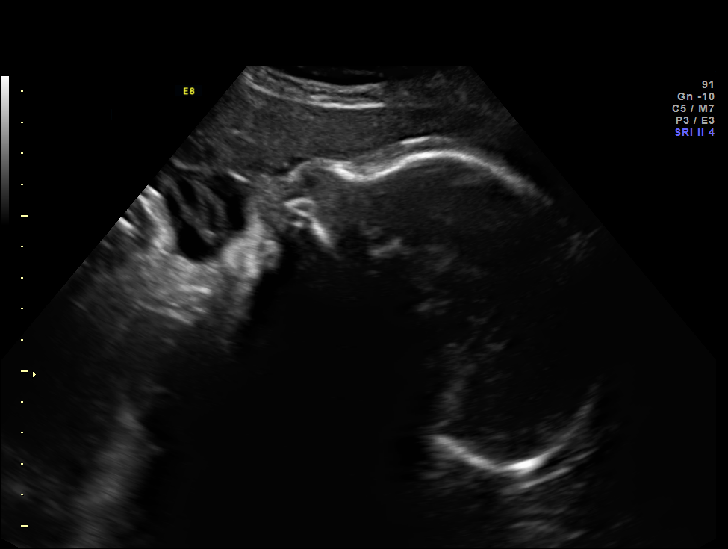
[im 50/52]
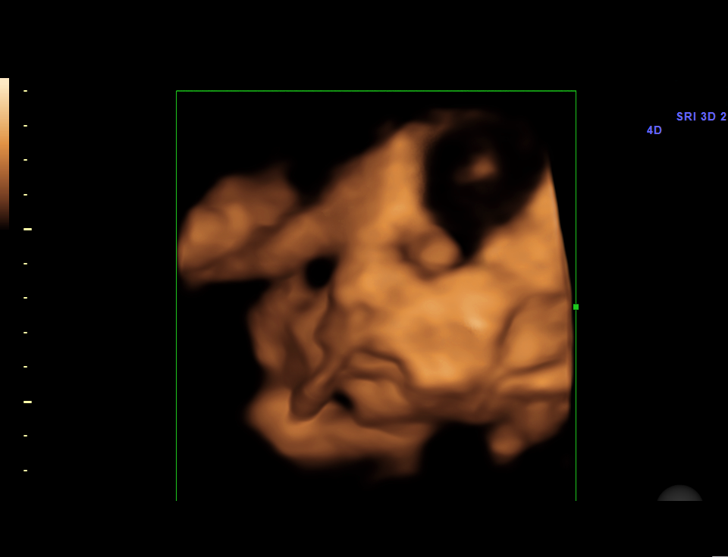

[12 of 28 positions shown; findings below may reference images not displayed]

OBSTETRICS REPORT
                      (Signed Final 12/08/2013 [DATE])

Service(s) Provided

 US OB FOLLOW UP                                       76816.1
Indications

 Size greater than dates (Large for gestational [AGE]
 Follow-up incomplete fetal anatomic evaluation
Fetal Evaluation

 Num Of Fetuses:    1
 Fetal Heart Rate:  141                          bpm
 Cardiac Activity:  Observed
 Presentation:      Cephalic
 Placenta:          Anterior, above cervical os
 P. Cord            Previously Visualized
 Insertion:

 Amniotic Fluid
 AFI FV:      Subjectively within normal limits
 AFI Sum:     15.79   cm       57  %Tile     Larg Pckt:    4.17  cm
 RUQ:   4       cm   RLQ:    4.17   cm    LUQ:   3.62    cm   LLQ:    4      cm
Biometry

 BPD:       90  mm     G. Age:  36w 4d                CI:         84.3   70 - 86
 OFD:    106.8  mm                                    FL/HC:      20.6   20.1 -

 HC:     315.1  mm     G. Age:  35w 2d       35  %    HC/AC:      0.92   0.93 -

 AC:     344.2  mm     G. Age:  38w 2d     > 97  %    FL/BPD:     72.2   71 - 87
 FL:        65  mm     G. Age:  33w 4d       18  %    FL/AC:      18.9   20 - 24
 HUM:     58.3  mm     G. Age:  33w 5d       47  %

 Est. FW:    1111  gm    6 lb 10 oz      90  %
Gestational Age

 LMP:           34w 0d        Date:  04/14/13                 EDD:   01/19/14
 U/S Today:     36w 0d                                        EDD:   01/05/14
 Best:          34w 4d     Det. By:  Early Ultrasound         EDD:   01/15/14
                                     (07/11/13)
Anatomy
 Cranium:          Appears normal         Aortic Arch:      Previously seen
 Fetal Cavum:      Appears normal         Ductal Arch:      Appears normal
 Ventricles:       Appears normal         Diaphragm:        Previously seen
 Choroid Plexus:   Previously seen        Stomach:          Appears normal, left
                                                            sided
 Cerebellum:       Previously seen        Abdomen:          Appears normal
 Posterior Fossa:  Previously seen        Abdominal Wall:   Previously seen
 Nuchal Fold:      Not applicable (>20    Cord Vessels:     Previously seen
                   wks GA)
 Face:             Orbits and profile     Kidneys:          Appear normal
                   previously seen
 Lips:             Previously seen        Bladder:          Appears normal
 Heart:            Appears normal         Spine:            Appears normal
                   (4CH, axis, and
                   situs)
 RVOT:             Appears normal         Lower             Previously seen
                                          Extremities:
 LVOT:             Previously seen        Upper             Previously seen
                                          Extremities:

 Other:  Fetus appears to be a male. Nasal bone previously visualized.
         Technically difficult due to advanced GA and fetal position.
Cervix Uterus Adnexa

 Cervix:       Not visualized (advanced GA >47wks)
Impression

 IUP at 34+4 weeks
 Normal interval anatomy; anatomic survey complete
 Normal amniotic fluid volume
 EFW at the 90th %tile; AC at the 97th %tile; fetus at risk to be
 LGA/macrosomic
Recommendations

 Follow-up as clinically indicated

## 2016-05-13 ENCOUNTER — Encounter: Payer: Self-pay | Admitting: Obstetrics & Gynecology

## 2016-05-13 ENCOUNTER — Ambulatory Visit (INDEPENDENT_AMBULATORY_CARE_PROVIDER_SITE_OTHER): Payer: BLUE CROSS/BLUE SHIELD | Admitting: Obstetrics & Gynecology

## 2016-05-13 VITALS — BP 142/83 | HR 102 | Ht 68.0 in | Wt 171.0 lb

## 2016-05-13 DIAGNOSIS — Z01419 Encounter for gynecological examination (general) (routine) without abnormal findings: Secondary | ICD-10-CM | POA: Diagnosis not present

## 2016-05-13 DIAGNOSIS — N6320 Unspecified lump in the left breast, unspecified quadrant: Secondary | ICD-10-CM

## 2016-05-13 NOTE — Patient Instructions (Signed)
Thank you for enrolling in [facility]. Please follow the instructions below to securely access your online medical record. MyChart allows you to send messages to your doctor, view your test results, manage appointments, and more.   How Do I Sign Up? 1. In your Internet browser, go to [employer] and enter [email or web address]. 2. Click on the Sign Up Now link in the Sign In box. You will see the New Member Sign Up page. 3. Enter your MyChart Access Code exactly as it appears below. You will not need to use this code after you've completed the sign-up process. If you do not sign up before the expiration date, you must request a new code.  MyChart Access Code: [email or web address] Expires: 07/12/2016  1:59 PM  4. Enter your Social Security Number ([email or web address]) and Date of Birth (mm/dd/yyyy) as indicated and click Submit. You will be taken to the next sign-up page. 5. Create a MyChart ID. This will be your MyChart login ID and cannot be changed, so think of one that is secure and easy to remember. 6. Create a MyChart password. You can change your password at any time. 7. Enter your Password Reset Question and Answer. This can be used at a later time if you forget your password.  8. Enter your e-mail address. You will receive e-mail notification when new information is available in [facility]. 9. Click Sign Up. You can now view your medical record.   Additional Information Remember, MyChart is NOT to be used for urgent needs. For medical emergencies, dial 911.   Preventive Care 18-39 Years, Female Preventive care refers to lifestyle choices and visits with your health care provider that can promote health and wellness. What does preventive care include?  A yearly physical exam. This is also called an annual well check.  Dental exams once or twice a year.  Routine eye exams. Ask your health care provider how often you should have your eyes checked.  Personal lifestyle choices,  including:  Daily care of your teeth and gums.  Regular physical activity.  Eating a healthy diet.  Avoiding tobacco and drug use.  Limiting alcohol use.  Practicing safe sex.  Taking vitamin and mineral supplements as recommended by your health care provider. What happens during an annual well check? The services and screenings done by your health care provider during your annual well check will depend on your age, overall health, lifestyle risk factors, and family history of disease. Counseling  Your health care provider may ask you questions about your:  Alcohol use.  Tobacco use.  Drug use.  Emotional well-being.  Home and relationship well-being.  Sexual activity.  Eating habits.  Work and work Statistician.  Method of birth control.  Menstrual cycle.  Pregnancy history. Screening  You may have the following tests or measurements:  Height, weight, and BMI.  Diabetes screening. This is done by checking your blood sugar (glucose) after you have not eaten for a while (fasting).  Blood pressure.  Lipid and cholesterol levels. These may be checked every 5 years starting at age 57.  Skin check.  Hepatitis C blood test.  Hepatitis B blood test.  Sexually transmitted disease (STD) testing.  BRCA-related cancer screening. This may be done if you have a family history of breast, ovarian, tubal, or peritoneal cancers.  Pelvic exam and Pap test. This may be done every 3 years starting at age 19. Starting at age 35, this may be done every 5 years if you have a Pap test  in combination with an HPV test. Discuss your test results, treatment options, and if necessary, the need for more tests with your health care provider. Vaccines  Your health care provider may recommend certain vaccines, such as:  Influenza vaccine. This is recommended every year.  Tetanus, diphtheria, and acellular pertussis (Tdap, Td) vaccine. You may need a Td booster every 10  years.  Varicella vaccine. You may need this if you have not been vaccinated.  HPV vaccine. If you are 29 or younger, you may need three doses over 6 months.  Measles, mumps, and rubella (MMR) vaccine. You may need at least one dose of MMR. You may also need a second dose.  Pneumococcal 13-valent conjugate (PCV13) vaccine. You may need this if you have certain conditions and were not previously vaccinated.  Pneumococcal polysaccharide (PPSV23) vaccine. You may need one or two doses if you smoke cigarettes or if you have certain conditions.  Meningococcal vaccine. One dose is recommended if you are age 41-21 years and a first-year college student living in a residence hall, or if you have one of several medical conditions. You may also need additional booster doses.  Hepatitis A vaccine. You may need this if you have certain conditions or if you travel or work in places where you may be exposed to hepatitis A.  Hepatitis B vaccine. You may need this if you have certain conditions or if you travel or work in places where you may be exposed to hepatitis B.  Haemophilus influenzae type b (Hib) vaccine. You may need this if you have certain risk factors. Talk to your health care provider about which screenings and vaccines you need and how often you need them. This information is not intended to replace advice given to you by your health care provider. Make sure you discuss any questions you have with your health care provider. Document Released: 07/15/2001 Document Revised: 02/06/2016 Document Reviewed: 03/20/2015 Elsevier Interactive Patient Education  2017 [employer].

## 2016-05-13 NOTE — Progress Notes (Signed)
GYNECOLOGY ANNUAL PREVENTATIVE CARE ENCOUNTER NOTE  Subjective:   Kayla Patterson is a 35 y.o. [id number] female here for a routine annual gynecologic exam.  Current complaints: reports feeling pea-sized lump in posterior part of left breast. Feels it better when she is standing, not when lying down. Mother-in-law recently was diagnosed with breast cancer so she is very nervous. No FH of breast or ovarian cancer. She is currently lactating; has a 35 year old son.  Denies abnormal vaginal bleeding, discharge, pelvic pain, problems with intercourse or other gynecologic concerns.    Gynecologic History Patient's last menstrual period was 05/08/2016. Contraception: none Last Pap: 2015. Results were: normal   Obstetric History OB History  Gravida Para Term Preterm AB Living  3 2 2  0 1 2  SAB TAB Ectopic Multiple Live Births  1 0 0 0 2    # Outcome Date GA Lbr Len/2nd Weight Sex Delivery Anes PTL Lv  3 Term 01/10/14 4924w2d  9 lb 4.2 oz (4.201 kg) M CS-LTranv Spinal  LIV  2 SAB 12/2005 7241w0d         1 Term 03/31/04 7741w0d 21:00 8 lb 7 oz (3.827 kg) M Vag-Spont Spinal  LIV     Birth Comments: 4th degree laceration      Past Medical History:  Diagnosis Date  . Migraine   . SAB (spontaneous abortion) 12/2005   7 wks  . SVD (spontaneous vaginal delivery) 03/31/2004    Past Surgical History:  Procedure Laterality Date  . CESAREAN SECTION WITH BILATERAL TUBAL LIGATION Bilateral 01/10/2014   Procedure: PRIMARY CESAREAN SECTION;  Surgeon: Tereso NewcomerUgonna A Anyanwu, MD;  Location: [facility];  Service: Obstetrics;  Laterality: Bilateral;  . WISDOM TOOTH EXTRACTION      Current Outpatient Prescriptions on File Prior to Visit  Medication Sig Dispense Refill  . Prenatal Vit-Fe Fumarate-FA (PRENATAL MULTIVITAMIN) TABS tablet Take 1 tablet by mouth daily at 12 noon.     No current facility-administered medications on file prior to visit.     Allergies  Allergen Reactions  . Shellfish Allergy Hives     Social History   Social History  . Marital status: Married    Spouse name: N/A  . Number of children: N/A  . Years of education: N/A   Occupational History  . Not on file.   Social History Main Topics  . Smoking status: Never Smoker  . Smokeless tobacco: Never Used  . Alcohol use No  . Drug use: No  . Sexual activity: Yes    Birth control/ protection: None   Other Topics Concern  . Not on file   Social History Narrative  . No narrative on file    Family History  Problem Relation Age of Onset  . Cancer Mother     Non-Hodgkins Lymphoma    The following portions of the patient's history were reviewed and updated as appropriate: allergies, current medications, past family history, past medical history, past social history, past surgical history and problem list.  Review of Systems Pertinent items noted in HPI and remainder of comprehensive ROS otherwise negative.   Objective:  BP (!) 142/83 (BP Location: Left Arm, Patient Position: Sitting)   Pulse (!) 102   Ht 5\' 8"  (1.727 m)   Wt 171 lb (77.6 kg)   LMP 05/08/2016   Breastfeeding? Yes   BMI 26.00 kg/m  CONSTITUTIONAL: Well-developed, well-nourished female in no acute distress.  HENT:  Normocephalic, atraumatic, External right and left ear normal. Oropharynx is  clear and moist EYES: Conjunctivae and EOM are normal. Pupils are equal, round, and reactive to light. No scleral icterus.  NECK: Normal range of motion, supple, no masses.  Normal thyroid.  SKIN: Skin is warm and dry. No rash noted. Not diaphoretic. No erythema. No pallor. NEUROLOGIC: Alert and oriented to person, place, and time. Normal reflexes, muscle tone coordination. No cranial nerve deficit noted. PSYCHIATRIC: Normal mood and affect. Normal behavior. Normal judgment and thought content. CARDIOVASCULAR: Normal heart rate noted, regular rhythm RESPIRATORY: Clear to auscultation bilaterally. Effort and breath sounds normal, no problems with  respiration noted. BREASTS: Symmetric in size. No masses, skin changes, nipple drainage, or lymphadenopathy bilaterally. Not able to palpate what the patient palpated, and she was unable to palpate it during examination. ABDOMEN: Soft, normal bowel sounds, no distention noted.  No tenderness, rebound or guarding.  PELVIC: Normal appearing external genitalia; normal appearing vaginal mucosa and cervix.  No abnormal discharge noted.  Pap smear obtained.  Normal uterine size, no other palpable masses, no uterine or adnexal tenderness. MUSCULOSKELETAL: Normal range of motion. No tenderness.  No cyanosis, clubbing, or edema.  2+ distal pulses.   Assessment and Plan:  1. Left breast lump No masses palpated on exam, patient reassured. Told to call back for worsening symptoms or if she desires imaging for further evaluation. Advised to continue lactation as she desires.  2. Encounter for gynecological examination with Papanicolaou smear of cervix - Cytology - PAP, will follow up results of pap smear and manage accordingly. Routine preventative health maintenance measures emphasized. Please refer to After Visit Summary for other counseling recommendations.    Jaynie CollinsUGONNA  ANYANWU, MD, FACOG Attending Obstetrician & Gynecologist, [facility]

## 2016-05-14 LAB — CYTOLOGY - PAP
DIAGNOSIS: NEGATIVE
HPV (WINDOPATH): NOT DETECTED

## 2017-04-09 ENCOUNTER — Encounter: Payer: Self-pay | Admitting: Radiology

## 2017-07-27 ENCOUNTER — Encounter: Payer: Self-pay | Admitting: Obstetrics & Gynecology

## 2017-07-27 ENCOUNTER — Ambulatory Visit (INDEPENDENT_AMBULATORY_CARE_PROVIDER_SITE_OTHER): Payer: BLUE CROSS/BLUE SHIELD | Admitting: Obstetrics & Gynecology

## 2017-07-27 VITALS — BP 127/81 | HR 71 | Wt 188.0 lb

## 2017-07-27 DIAGNOSIS — Z124 Encounter for screening for malignant neoplasm of cervix: Secondary | ICD-10-CM | POA: Diagnosis not present

## 2017-07-27 DIAGNOSIS — N946 Dysmenorrhea, unspecified: Secondary | ICD-10-CM

## 2017-07-27 DIAGNOSIS — Z1151 Encounter for screening for human papillomavirus (HPV): Secondary | ICD-10-CM | POA: Diagnosis not present

## 2017-07-27 DIAGNOSIS — Z01419 Encounter for gynecological examination (general) (routine) without abnormal findings: Secondary | ICD-10-CM | POA: Diagnosis not present

## 2017-07-27 NOTE — Progress Notes (Signed)
GYNECOLOGY ANNUAL PREVENTATIVE CARE ENCOUNTER NOTE  Subjective:   Kayla Patterson is a 37 y.o. [phone] female here for a routine annual gynecologic exam.  Current complaints: worsening dysmenorrhea over the past year.  No menorrhagia, no other associated symptoms.   Denies abnormal vaginal bleeding, discharge, other pelvic pain, problems with intercourse or other gynecologic concerns.    Gynecologic History Patient's last menstrual period was 07/01/2017 (approximate). Contraception: none Last Pap: 05/2016. Results were: normal with negative HPV  Obstetric History OB History  Gravida Para Term Preterm AB Living  3 2 2  0 1 2  SAB TAB Ectopic Multiple Live Births  1 0 0 0 2    # Outcome Date GA Lbr Len/2nd Weight Sex Delivery Anes PTL Lv  3 Term 01/10/14 [redacted]w[redacted]d  9 lb 4.2 oz (4.201 kg) M CS-LTranv Spinal  LIV  2 SAB 12/2005 [redacted]w[redacted]d         1 Term 03/31/04 [redacted]w[redacted]d 21:00 8 lb 7 oz (3.827 kg) M Vag-Spont Spinal  LIV     Birth Comments: 4th degree laceration      Past Medical History:  Diagnosis Date  . Migraine   . SAB (spontaneous abortion) 12/2005   7 wks  . SVD (spontaneous vaginal delivery) 03/31/2004    Past Surgical History:  Procedure Laterality Date  . CESAREAN SECTION WITH BILATERAL TUBAL LIGATION Bilateral 01/10/2014   Procedure: PRIMARY CESAREAN SECTION;  Surgeon: Tereso Newcomer, MD;  Location: [facility];  Service: Obstetrics;  Laterality: Bilateral;  . WISDOM TOOTH EXTRACTION      Current Outpatient Medications on File Prior to Visit  Medication Sig Dispense Refill  . Prenatal Vit-Fe Fumarate-FA (PRENATAL MULTIVITAMIN) TABS tablet Take 1 tablet by mouth daily at 12 noon.     No current facility-administered medications on file prior to visit.     Allergies  Allergen Reactions  . Shellfish Allergy Hives    Social History   Socioeconomic History  . Marital status: Married    Spouse name: Not on file  . Number of children: Not on file  . Years of education:  Not on file  . Highest education level: Not on file  Social Needs  . Financial resource strain: Not on file  . Food insecurity - worry: Not on file  . Food insecurity - inability: Not on file  . Transportation needs - medical: Not on file  . Transportation needs - non-medical: Not on file  Occupational History  . Not on file  Tobacco Use  . Smoking status: Never Smoker  . Smokeless tobacco: Never Used  Substance and Sexual Activity  . Alcohol use: No  . Drug use: No  . Sexual activity: Yes    Birth control/protection: None  Other Topics Concern  . Not on file  Social History Narrative  . Not on file    Family History  Problem Relation Age of Onset  . Cancer Mother        Non-Hodgkins Lymphoma    The following portions of the patient's history were reviewed and updated as appropriate: allergies, current medications, past family history, past medical history, past social history, past surgical history and problem list.  Review of Systems Pertinent items noted in HPI and remainder of comprehensive ROS otherwise negative.   Objective:  BP 127/81   Pulse 71   Wt 188 lb (85.3 kg)   LMP 07/01/2017 (Approximate)   BMI 28.59 kg/m  CONSTITUTIONAL: Well-developed, well-nourished female in no acute distress.  HENT:  Normocephalic, atraumatic, External right and left ear normal. Oropharynx is clear and moist EYES: Conjunctivae and EOM are normal. Pupils are equal, round, and reactive to light. No scleral icterus.  NECK: Normal range of motion, supple, no masses.  Normal thyroid.  SKIN: Skin is warm and dry. No rash noted. Not diaphoretic. No erythema. No pallor. NEUROLOGIC: Alert and oriented to person, place, and time. Normal reflexes, muscle tone coordination. No cranial nerve deficit noted. PSYCHIATRIC: Normal mood and affect. Normal behavior. Normal judgment and thought content. CARDIOVASCULAR: Normal heart rate noted, regular rhythm RESPIRATORY: Clear to auscultation  bilaterally. Effort and breath sounds normal, no problems with respiration noted. BREASTS: Symmetric in size. No masses, skin changes, nipple drainage, or lymphadenopathy. ABDOMEN: Soft, normal bowel sounds, no distention noted.  No tenderness, rebound or guarding.  PELVIC: Normal appearing external genitalia; normal appearing vaginal mucosa and cervix.  No abnormal discharge noted.  Pap smear obtained.  Normal uterine size, no other palpable masses, no uterine or adnexal tenderness. MUSCULOSKELETAL: Normal range of motion. No tenderness.  No cyanosis, clubbing, or edema.  2+ distal pulses.   Assessment and Plan:  1. Dysmenorrhea Pain just limited to time of periods, no dysmenorrhea or other pelvic pain. Could be due to changing period characteristics with age, structural abnormality, or possible endometriosis.  Recommended Naproxen 440-500mg  po bid to start one-two days prior to her period and during her period. Will observe response.  - US PELVIC COMPLETE WITH TRANSVAGINAL; Future  2. Encounter for gynecological examination with Papanicolaou smear of cervix - Cytology - PAP Will follow up results of pap smear and manage accordingly. Routine preventative health maintenance measures emphasized. Please refer to After Visit Summary for other counseling recommendations.     Jaynie CollinsUGONNA  Prerna Harold, MD, FACOG Obstetrician & Gynecologist, [facility], [facility]

## 2017-07-28 LAB — CYTOLOGY - PAP
Diagnosis: NEGATIVE
HPV: NOT DETECTED

## 2017-10-23 ENCOUNTER — Ambulatory Visit (HOSPITAL_COMMUNITY): Payer: BLUE CROSS/BLUE SHIELD

## 2018-02-02 ENCOUNTER — Ambulatory Visit: Payer: BLUE CROSS/BLUE SHIELD | Admitting: Obstetrics & Gynecology

## 2018-02-02 NOTE — Progress Notes (Deleted)
   Patient did not show up today for her scheduled appointment.   Kelee Cunningham, MD, FACOG Obstetrician & Gynecologist, [facility], [facility]  

## 2018-02-15 ENCOUNTER — Ambulatory Visit (INDEPENDENT_AMBULATORY_CARE_PROVIDER_SITE_OTHER): Payer: BLUE CROSS/BLUE SHIELD | Admitting: Obstetrics & Gynecology

## 2018-02-15 ENCOUNTER — Encounter: Payer: Self-pay | Admitting: Obstetrics & Gynecology

## 2018-02-15 VITALS — BP 120/78 | HR 91 | Resp 16 | Ht 68.0 in | Wt 172.4 lb

## 2018-02-15 DIAGNOSIS — N812 Incomplete uterovaginal prolapse: Secondary | ICD-10-CM | POA: Diagnosis not present

## 2018-02-15 NOTE — Progress Notes (Signed)
   GYNECOLOGY OFFICE VISIT NOTE  History:  37 y.o. [id number] here today for evaluation of possible pelvic organ prolapse. She noted feeling something in her vagina about one month ago, after doing some heavy lifting.  She examined herself and noticed her cervix seemed lower. No problems with urination or bowel movements. She denies any abnormal vaginal discharge, bleeding, pelvic pain or other concerns.   Past Medical History:  Diagnosis Date  . Migraine     Past Surgical History:  Procedure Laterality Date  . CESAREAN SECTION WITH BILATERAL TUBAL LIGATION Bilateral 01/10/2014   Procedure: PRIMARY CESAREAN SECTION;  Surgeon: Tereso NewcomerUgonna A Aireal Slater, MD;  Location: [facility];  Service: Obstetrics;  Laterality: Bilateral;  . WISDOM TOOTH EXTRACTION      The following portions of the patient's history were reviewed and updated as appropriate: allergies, current medications, past family history, past medical history, past social history, past surgical history and problem list.   Health Maintenance:  Normal pap and negative HRHPV on 07/27/2017.   Review of Systems:  Pertinent items noted in HPI and remainder of comprehensive ROS otherwise negative.  Objective:  Physical Exam BP 120/78 (BP Location: Left Arm, Patient Position: Sitting, Cuff Size: Normal)   Pulse 91   Resp 16   Ht 5\' 8"  (1.727 m)   Wt 172 lb 6.4 oz (78.2 kg)   LMP 02/08/2018 (Exact Date)   Breastfeeding? Yes   BMI 26.21 kg/m  CONSTITUTIONAL: Well-developed, well-nourished female in no acute distress.  HEENT:  Normocephalic, atraumatic. External right and left ear normal. No scleral icterus.  NECK: Normal range of motion, supple, no masses noted on observation SKIN: Skin is warm and dry. No rash noted. Not diaphoretic. No erythema. No pallor. MUSCULOSKELETAL: Normal range of motion. No edema noted. NEUROLOGIC: Alert and oriented to person, place, and time. Normal muscle tone coordination. No cranial nerve deficit  noted. PSYCHIATRIC: Normal mood and affect. Normal behavior. Normal judgment and thought content. CARDIOVASCULAR: Normal heart rate noted RESPIRATORY: Effort and breath sounds normal, no problems with respiration noted ABDOMEN: Soft, no distention noted.   PELVIC: Normal appearing external genitalia; normal appearing vaginal mucosa and cervix.  No abnormal discharge noted.  First degree uterine prolapse noted with Valsalva, mild cystocele and rectocele noted. Normal uterine size, no other palpable masses, no uterine or adnexal tenderness.  Assessment & Plan:  1. First degree uterine prolapse Patient reassured about symptoms and examination findings.  Discussed possible interventions (Surgery etc), she declines this at this point. She was told to come for any worsening symptoms.  Routine preventative health maintenance measures emphasized. Please refer to After Visit Summary for other counseling recommendations.   Return if symptoms worsen or fail to improve.   Total face-to-face time with patient: 15 minutes.  Over 50% of encounter was spent on counseling and coordination of care.   Jaynie CollinsUGONNA  Eiden Bagot, MD, FACOG Obstetrician & Gynecologist, [facility], [facility]

## 2018-02-15 NOTE — Patient Instructions (Signed)
Pelvic Organ Prolapse Pelvic organ prolapse is the stretching, bulging, or dropping of pelvic organs into an abnormal position. It happens when the muscles and tissues that surround and support pelvic structures are stretched or weak. Pelvic organ prolapse can involve:  Vagina (vaginal prolapse).  Uterus (uterine prolapse).  Bladder (cystocele).  Rectum (rectocele).  Intestines (enterocele).  When organs other than the vagina are involved, they often bulge into the vagina or protrude from the vagina, depending on how severe the prolapse is. What are the causes? Causes of this condition include:  Pregnancy, labor, and childbirth.  Long-lasting (chronic) cough.  Chronic constipation.  Obesity.  Past pelvic surgery.  Aging. During and after menopause, a decreased production of the hormone estrogen can weaken pelvic ligaments and muscles.  Consistently lifting more than 50 lb (23 kg).  Buildup of fluid in the abdomen due to certain diseases and other conditions.  What are the signs or symptoms? Symptoms of this condition include:  Loss of bladder control when you cough, sneeze, strain, and exercise (stress incontinence). This may be worse immediately following childbirth, and it may gradually improve over time.  Feeling pressure in your pelvis or vagina. This pressure may increase when you cough or when you are having a bowel movement.  A bulge that protrudes from the opening of your vagina or against your vaginal wall. If your uterus protrudes through the opening of your vagina and rubs against your clothing, you may also experience soreness, ulcers, infection, pain, and bleeding.  Increased effort to have a bowel movement or urinate.  Pain in your low back.  Pain, discomfort, or disinterest in sexual intercourse.  Repeated bladder infections (urinary tract infections).  Difficulty inserting or inability to insert a tampon or applicator.  In some people, this  condition does not cause any symptoms. How is this diagnosed? Your health care provider may perform an internal and external vaginal and rectal exam. During the exam, you may be asked to cough and strain while you are lying down, sitting, and standing up. Your health care provider will determine if other tests are required, such as bladder function tests. How is this treated? In most cases, this condition needs to be treated only if it produces symptoms. No treatment is guaranteed to correct the prolapse or relieve the symptoms completely. Treatment may include:  Lifestyle changes, such as: ? Avoiding drinking beverages that contain caffeine. ? Increasing your intake of high-fiber foods. This can help to decrease constipation and straining during bowel movements. ? Emptying your bladder at scheduled times (bladder training therapy). This can help to reduce or avoid urinary incontinence. ? Losing weight if you are overweight or obese.  Estrogen. Estrogen may help mild prolapse by increasing the strength and tone of pelvic floor muscles.  Kegel exercises. These may help mild cases of prolapse by strengthening and tightening the muscles of the pelvic floor.  Pessary insertion. A pessary is a soft, flexible device that is placed into your vagina by your health care provider to help support the vaginal walls and keep pelvic organs in place.  Surgery. This is often the only form of treatment for severe prolapse. Different types of surgeries are available.  Follow these instructions at home:  Wear a sanitary pad or absorbent product if you have urinary incontinence.  Avoid heavy lifting and straining with exercise and work. Do not hold your breath when you perform mild to moderate lifting and exercise activities. Limit your activities as directed by your health care   provider.  Take medicines only as directed by your health care provider.  Perform Kegel exercises as directed by your health care  provider.  If you have a pessary, take care of it as directed by your health care provider. Contact a health care provider if:  Your symptoms interfere with your daily activities or sex life.  You need medicine to help with the discomfort.  You notice bleeding from the vagina that is not related to your period.  You have a fever.  You have pain or bleeding when you urinate.  You have bleeding when you have a bowel movement.  You lose urine when you have sex.  You have chronic constipation.  You have a pessary that falls out.  You have vaginal discharge that has a bad smell.  You have low abdominal pain or cramping that is unusual for you. This information is not intended to replace advice given to you by your health care provider. Make sure you discuss any questions you have with your health care provider. Document Released: 12/14/2013 Document Revised: 10/25/2015 Document Reviewed: 08/01/2013 Elsevier Interactive Patient Education  2018 [employer]  

## 2018-02-23 DIAGNOSIS — M25511 Pain in right shoulder: Secondary | ICD-10-CM | POA: Diagnosis not present

## 2018-02-23 DIAGNOSIS — M542 Cervicalgia: Secondary | ICD-10-CM | POA: Diagnosis not present

## 2018-02-25 DIAGNOSIS — M898X1 Other specified disorders of bone, shoulder: Secondary | ICD-10-CM | POA: Diagnosis not present

## 2018-02-25 DIAGNOSIS — M6281 Muscle weakness (generalized): Secondary | ICD-10-CM | POA: Diagnosis not present

## 2018-02-25 DIAGNOSIS — M25511 Pain in right shoulder: Secondary | ICD-10-CM | POA: Diagnosis not present

## 2018-02-27 DIAGNOSIS — H5203 Hypermetropia, bilateral: Secondary | ICD-10-CM | POA: Diagnosis not present

## 2018-03-01 DIAGNOSIS — M6281 Muscle weakness (generalized): Secondary | ICD-10-CM | POA: Diagnosis not present

## 2018-03-01 DIAGNOSIS — M25511 Pain in right shoulder: Secondary | ICD-10-CM | POA: Diagnosis not present

## 2018-03-01 DIAGNOSIS — M898X1 Other specified disorders of bone, shoulder: Secondary | ICD-10-CM | POA: Diagnosis not present

## 2018-03-03 DIAGNOSIS — M6281 Muscle weakness (generalized): Secondary | ICD-10-CM | POA: Diagnosis not present

## 2018-03-03 DIAGNOSIS — M25511 Pain in right shoulder: Secondary | ICD-10-CM | POA: Diagnosis not present

## 2018-03-03 DIAGNOSIS — M898X1 Other specified disorders of bone, shoulder: Secondary | ICD-10-CM | POA: Diagnosis not present

## 2018-03-08 DIAGNOSIS — M898X1 Other specified disorders of bone, shoulder: Secondary | ICD-10-CM | POA: Diagnosis not present

## 2018-03-08 DIAGNOSIS — M6281 Muscle weakness (generalized): Secondary | ICD-10-CM | POA: Diagnosis not present

## 2018-03-08 DIAGNOSIS — M25511 Pain in right shoulder: Secondary | ICD-10-CM | POA: Diagnosis not present

## 2018-03-10 DIAGNOSIS — M898X1 Other specified disorders of bone, shoulder: Secondary | ICD-10-CM | POA: Diagnosis not present

## 2018-03-10 DIAGNOSIS — M25511 Pain in right shoulder: Secondary | ICD-10-CM | POA: Diagnosis not present

## 2018-03-10 DIAGNOSIS — M6281 Muscle weakness (generalized): Secondary | ICD-10-CM | POA: Diagnosis not present

## 2018-10-20 ENCOUNTER — Encounter: Payer: Self-pay | Admitting: Obstetrics & Gynecology

## 2018-10-21 ENCOUNTER — Encounter (HOSPITAL_COMMUNITY): Payer: Self-pay | Admitting: Obstetrics & Gynecology

## 2019-12-02 DIAGNOSIS — M542 Cervicalgia: Secondary | ICD-10-CM | POA: Diagnosis not present

## 2019-12-02 DIAGNOSIS — M25511 Pain in right shoulder: Secondary | ICD-10-CM | POA: Diagnosis not present

## 2019-12-12 DIAGNOSIS — Z0100 Encounter for examination of eyes and vision without abnormal findings: Secondary | ICD-10-CM | POA: Diagnosis not present

## 2019-12-14 DIAGNOSIS — M25511 Pain in right shoulder: Secondary | ICD-10-CM | POA: Diagnosis not present

## 2019-12-16 DIAGNOSIS — M25511 Pain in right shoulder: Secondary | ICD-10-CM | POA: Diagnosis not present

## 2023-02-06 ENCOUNTER — Ambulatory Visit (INDEPENDENT_AMBULATORY_CARE_PROVIDER_SITE_OTHER): Payer: BC Managed Care – PPO | Admitting: Orthopedic Surgery

## 2023-02-06 ENCOUNTER — Other Ambulatory Visit (INDEPENDENT_AMBULATORY_CARE_PROVIDER_SITE_OTHER): Payer: BC Managed Care – PPO

## 2023-02-06 ENCOUNTER — Encounter: Payer: Self-pay | Admitting: Orthopedic Surgery

## 2023-02-06 DIAGNOSIS — M542 Cervicalgia: Secondary | ICD-10-CM | POA: Diagnosis not present

## 2023-02-06 NOTE — Progress Notes (Signed)
Office Visit Note   Patient: Kayla Patterson           Date of Birth: September 19, 1980           MRN: [id number] Visit Date: 02/06/2023 Requested by: No referring provider defined for this encounter. PCP: Patient, No Pcp Per  Subjective: Chief Complaint  Patient presents with   Other    Neck/right shoulder pain    HPI: Frannie Avery is a 42 y.o. female who presents to the office reporting neck and right shoulder pain.  The pain has been chronic for 5 years.  She states she was carrying her child in a strap and the strap slipped and she developed some radicular pain going down the arm along with numbness and tingling.  Pain does not wake the patient from sleep at night.  She does limit her range of motion some because certain movements aggravate her shoulder.  The pain does radiate up into her neck as well.  Does describe some scapular pain and stiffness in the neck.  She describes dislocation at age 34 and age 50 on that right shoulder.  No subsequent dislocations in the past 25 years.  She has had an MRI scan on the right shoulder in 2021.  Was told that she may need shoulder stabilization surgery and she underwent physical therapy for cuff strengthening.  Shoulder scan was normal.  I was not able to review the images.  She does describe fairly focal lateral pain just lateral to the acromion..                ROS: All systems reviewed are negative as they relate to the chief complaint within the history of present illness.  Patient denies fevers or chills.  Assessment & Plan: Visit Diagnoses:  1. Neck pain     Plan: Impression is fairly benign right shoulder exam.  She does have loss of lordosis on cervical spine radiographs this looks like it could be radiculopathy particularly with the numbness and tingling she describes occasionally in digits 1 and 2.  She only has neck pain on the right-hand side.  Symptoms ongoing now for 5 years.  Failure of conservative treatment including ibuprofen as  well as formal physical therapy and activity modification.  MRI C-spine indicated for likely interventional ESI.  Follow-up after that study.  Follow-Up Instructions: No follow-ups on file.   Orders:  Orders Placed This Encounter  Procedures   XR Cervical Spine 2 or 3 views   MR Cervical Spine w/o contrast   No orders of the defined types were placed in this encounter.     Procedures: No procedures performed   Clinical Data: No additional findings.  Objective: Vital Signs: There were no vitals taken for this visit.  Physical Exam:  Constitutional: Patient appears well-developed HEENT:  Head: Normocephalic Eyes:EOM are normal Neck: Normal range of motion Cardiovascular: Normal rate Pulmonary/chest: Effort normal Neurologic: Patient is alert Skin: Skin is warm Psychiatric: Patient has normal mood and affect  Ortho Exam: Ortho exam demonstrates 5 out of 5 grip EPL FPL interosseous with flexion extension bicep triceps and deltoid strength.  Radial pulse intact bilaterally.  No definite paresthesias C5-T1.  Cervical spine range of motion is full with flexion extension and rotation.  Bilateral shoulder passive range of motion is 70/110/180.  Negative apprehension on the right-hand side.  Rotator cuff strength intact infraspinatus supraspinatus and subscap muscle testing on the right with negative O'Brien's testing and no grinding with labral load testing.  Reflexes symmetric 1+ out of 4 bilateral biceps and triceps.  Specialty Comments:  No specialty comments available.  Imaging: No results found.   PMFS History: There are no problems to display for this patient.  Past Medical History:  Diagnosis Date   Migraine     Family History  Problem Relation Age of Onset   Cancer Mother        Non-Hodgkins Lymphoma    Past Surgical History:  Procedure Laterality Date   CESAREAN SECTION N/A 01/10/2014   Procedure: CESAREAN SECTION;  Surgeon: Tereso Newcomer, MD;  Location:  [facility];  Service: Obstetrics;  Laterality: N/A;   WISDOM TOOTH EXTRACTION     Social History   Occupational History   Not on file  Tobacco Use   Smoking status: Never   Smokeless tobacco: Never  Substance and Sexual Activity   Alcohol use: No   Drug use: No   Sexual activity: Yes    Birth control/protection: None

## 2023-02-18 ENCOUNTER — Ambulatory Visit
Admission: RE | Admit: 2023-02-18 | Discharge: 2023-02-18 | Disposition: A | Discharge status: Home / Self Care | Payer: BC Managed Care – PPO | Source: Ambulatory Visit | Attending: Orthopedic Surgery | Admitting: Orthopedic Surgery

## 2023-02-18 ENCOUNTER — Other Ambulatory Visit: Payer: BC Managed Care – PPO

## 2023-02-18 DIAGNOSIS — M542 Cervicalgia: Secondary | ICD-10-CM

## 2023-02-27 ENCOUNTER — Telehealth: Payer: Self-pay

## 2023-02-27 NOTE — Telephone Encounter (Signed)
Dr August Saucer called with results

## 2023-02-27 NOTE — Progress Notes (Signed)
I called and we discussed her scan.  Essentially 3 options would be live with it versus injection versus surgical consultation.  She is going to consider her options and let me know if she wants to proceed with any type of intervention.  If she has any follow-up can you cancel it at this time.  Thanks

## 2023-02-27 NOTE — Telephone Encounter (Signed)
-----   Message from Burnard Bunting sent at 02/27/2023 11:58 AM EDT ----- I called and we discussed her scan.  Essentially 3 options would be live with it versus injection versus surgical consultation.  She is going to consider her options and let me know if she wants to proceed with any type of intervention.  If she has a [place] follow-up can you cancel it at this time.  Thanks
